# Patient Record
Sex: Male | Born: 1970
Health system: Southern US, Community
[De-identification: ages and names within clinical notes are randomized; demographics above are authoritative.]

---

## 1999-07-04 ENCOUNTER — Other Ambulatory Visit (HOSPITAL_COMMUNITY): Admission: RE | Admit: 1999-07-04 | Discharge: 1999-08-09 | Payer: Self-pay | Admitting: Psychiatry

## 2001-04-02 ENCOUNTER — Encounter: Payer: Self-pay | Admitting: Emergency Medicine

## 2001-04-02 ENCOUNTER — Emergency Department (HOSPITAL_COMMUNITY): Admission: EM | Admit: 2001-04-02 | Discharge: 2001-04-02 | Payer: Self-pay | Admitting: Emergency Medicine

## 2002-05-17 ENCOUNTER — Emergency Department (HOSPITAL_COMMUNITY): Admission: EM | Admit: 2002-05-17 | Discharge: 2002-05-17 | Payer: Self-pay | Admitting: Emergency Medicine

## 2003-04-11 ENCOUNTER — Encounter: Payer: Self-pay | Admitting: Family Medicine

## 2003-04-11 ENCOUNTER — Ambulatory Visit (HOSPITAL_COMMUNITY): Admission: RE | Admit: 2003-04-11 | Discharge: 2003-04-11 | Payer: Self-pay | Admitting: Family Medicine

## 2003-05-15 ENCOUNTER — Encounter: Payer: Self-pay | Admitting: Family Medicine

## 2003-05-15 ENCOUNTER — Ambulatory Visit (HOSPITAL_COMMUNITY): Admission: RE | Admit: 2003-05-15 | Discharge: 2003-05-15 | Payer: Self-pay | Admitting: Family Medicine

## 2005-04-08 ENCOUNTER — Emergency Department (HOSPITAL_COMMUNITY): Admission: EM | Admit: 2005-04-08 | Discharge: 2005-04-08 | Payer: Self-pay | Admitting: Emergency Medicine

## 2008-01-29 ENCOUNTER — Emergency Department (HOSPITAL_COMMUNITY): Admission: EM | Admit: 2008-01-29 | Discharge: 2008-01-29 | Payer: Self-pay | Admitting: Emergency Medicine

## 2011-07-08 LAB — RAPID URINE DRUG SCREEN, HOSP PERFORMED
Amphetamines: NOT DETECTED
Barbiturates: NOT DETECTED
Benzodiazepines: NOT DETECTED
Cocaine: NOT DETECTED
Opiates: NOT DETECTED
Tetrahydrocannabinol: NOT DETECTED

## 2011-07-08 LAB — URINALYSIS, ROUTINE W REFLEX MICROSCOPIC
Bilirubin Urine: NEGATIVE
Glucose, UA: NEGATIVE
Hgb urine dipstick: NEGATIVE
Ketones, ur: NEGATIVE
Nitrite: NEGATIVE
Protein, ur: NEGATIVE
Specific Gravity, Urine: 1.02
Urobilinogen, UA: 0.2
pH: 6.5

## 2011-07-08 LAB — POCT I-STAT, CHEM 8
BUN: 13
Calcium, Ion: 1.21
Chloride: 103
Creatinine, Ser: 1.1
Glucose, Bld: 93
HCT: 46
Hemoglobin: 15.6
Potassium: 3.7
Sodium: 142
TCO2: 30

## 2015-03-26 ENCOUNTER — Encounter (HOSPITAL_COMMUNITY): Payer: Self-pay | Admitting: Neurology

## 2015-03-26 ENCOUNTER — Emergency Department (HOSPITAL_COMMUNITY): Payer: Self-pay

## 2015-03-26 ENCOUNTER — Emergency Department (HOSPITAL_COMMUNITY)
Admission: EM | Admit: 2015-03-26 | Discharge: 2015-03-26 | Disposition: A | Discharge status: Home / Self Care | Payer: Self-pay | Attending: Emergency Medicine | Admitting: Emergency Medicine

## 2015-03-26 DIAGNOSIS — T07XXXA Unspecified multiple injuries, initial encounter: Secondary | ICD-10-CM

## 2015-03-26 DIAGNOSIS — S80212A Abrasion, left knee, initial encounter: Secondary | ICD-10-CM | POA: Insufficient documentation

## 2015-03-26 DIAGNOSIS — S3991XA Unspecified injury of abdomen, initial encounter: Secondary | ICD-10-CM | POA: Insufficient documentation

## 2015-03-26 DIAGNOSIS — Y998 Other external cause status: Secondary | ICD-10-CM | POA: Insufficient documentation

## 2015-03-26 DIAGNOSIS — Y92410 Unspecified street and highway as the place of occurrence of the external cause: Secondary | ICD-10-CM | POA: Insufficient documentation

## 2015-03-26 DIAGNOSIS — T148 Other injury of unspecified body region: Secondary | ICD-10-CM | POA: Insufficient documentation

## 2015-03-26 DIAGNOSIS — S80211A Abrasion, right knee, initial encounter: Secondary | ICD-10-CM | POA: Insufficient documentation

## 2015-03-26 DIAGNOSIS — Y9389 Activity, other specified: Secondary | ICD-10-CM | POA: Insufficient documentation

## 2015-03-26 LAB — COMPREHENSIVE METABOLIC PANEL
ALBUMIN: 4.7 g/dL (ref 3.5–5.0)
ALT: 17 U/L (ref 17–63)
ANION GAP: 18 — AB (ref 5–15)
AST: 30 U/L (ref 15–41)
Alkaline Phosphatase: 74 U/L (ref 38–126)
BUN: 18 mg/dL (ref 6–20)
CALCIUM: 10.1 mg/dL (ref 8.9–10.3)
CO2: 17 mmol/L — ABNORMAL LOW (ref 22–32)
CREATININE: 1.34 mg/dL — AB (ref 0.61–1.24)
Chloride: 102 mmol/L (ref 101–111)
GFR calc non Af Amer: >60 mL/min (ref >60)
GLUCOSE: 114 mg/dL — AB (ref 65–99)
Potassium: 3.6 mmol/L (ref 3.5–5.1)
Sodium: 137 mmol/L (ref 135–145)
TOTAL PROTEIN: 7.4 g/dL (ref 6.5–8.1)
Total Bilirubin: 0.7 mg/dL (ref 0.3–1.2)

## 2015-03-26 LAB — CBC
HEMATOCRIT: 45.3 % (ref 39.0–52.0)
Hemoglobin: 15.9 g/dL (ref 13.0–17.0)
MCH: 30.2 pg (ref 26.0–34.0)
MCHC: 35.1 g/dL (ref 30.0–36.0)
MCV: 86 fL (ref 78.0–100.0)
Platelets: 375 10*3/uL (ref 150–400)
RBC: 5.27 MIL/uL (ref 4.22–5.81)
RDW: 14 % (ref 11.5–15.5)
WBC: 13.2 10*3/uL — AB (ref 4.0–10.5)

## 2015-03-26 LAB — PROTIME-INR
INR: 1.21 (ref 0.00–1.49)
Prothrombin Time: 15.5 seconds — ABNORMAL HIGH (ref 11.6–15.2)

## 2015-03-26 LAB — ETHANOL: Alcohol, Ethyl (B): <5 mg/dL (ref <5)

## 2015-03-26 LAB — SAMPLE TO BLOOD BANK

## 2015-03-26 MED ORDER — SODIUM CHLORIDE 0.9 % IV BOLUS (SEPSIS)
1000.0000 mL | Freq: Once | INTRAVENOUS | Status: AC
  Administered 2015-03-26: 1000 mL via INTRAVENOUS

## 2015-03-26 MED ORDER — IOHEXOL 300 MG/ML  SOLN
100.0000 mL | Freq: Once | INTRAMUSCULAR | Status: AC | PRN
Start: 2015-03-26 — End: 2015-03-26
  Administered 2015-03-26: 100 mL via INTRAVENOUS

## 2015-03-26 MED ORDER — ONDANSETRON HCL 4 MG/2ML IJ SOLN
4.0000 mg | Freq: Once | INTRAMUSCULAR | Status: AC
  Administered 2015-03-26: 4 mg via INTRAVENOUS
  Filled 2015-03-26: qty 2

## 2015-03-26 NOTE — ED Notes (Signed)
Patient transported to CT 
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
## 2015-03-26 NOTE — ED Notes (Signed)
Pt is talking to RN, c/o pain all over mainly in legs.

## 2015-03-26 NOTE — Discharge Instructions (Signed)
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
## 2017-09-14 ENCOUNTER — Ambulatory Visit: Payer: Self-pay | Attending: Internal Medicine

## 2017-12-22 MED FILL — hydrOXYzine HCL 25 MG TABS: 25 | 30 days supply | Qty: 90 | Fill #0

## 2017-12-22 MED FILL — SERTRALINE HCL 100 MG TAB: 100 | 30 days supply | Qty: 30 | Fill #0

## 2017-12-22 MED FILL — DIVALPROEX SOD DR 500 MG TA: 500 | 30 days supply | Qty: 90 | Fill #0

## 2017-12-22 MED FILL — QUETIAPINE FUMARATE 200 MG: 200 | 30 days supply | Qty: 30 | Fill #0

## 2018-01-28 MED FILL — QUETIAPINE FUMARATE 200 MG: 200 | 30 days supply | Qty: 30 | Fill #1

## 2021-02-04 ENCOUNTER — Encounter (HOSPITAL_COMMUNITY): Payer: Self-pay | Admitting: Emergency Medicine

## 2021-02-04 ENCOUNTER — Emergency Department (HOSPITAL_COMMUNITY): Payer: Self-pay

## 2021-02-04 ENCOUNTER — Other Ambulatory Visit: Payer: Self-pay

## 2021-02-04 ENCOUNTER — Emergency Department (HOSPITAL_COMMUNITY)
Admission: EM | Admit: 2021-02-04 | Discharge: 2021-02-04 | Disposition: A | Discharge status: Home / Self Care | Payer: Self-pay | Attending: Emergency Medicine | Admitting: Emergency Medicine

## 2021-02-04 DIAGNOSIS — F17210 Nicotine dependence, cigarettes, uncomplicated: Secondary | ICD-10-CM | POA: Insufficient documentation

## 2021-02-04 DIAGNOSIS — S3992XA Unspecified injury of lower back, initial encounter: Secondary | ICD-10-CM | POA: Insufficient documentation

## 2021-02-04 DIAGNOSIS — W19XXXA Unspecified fall, initial encounter: Secondary | ICD-10-CM

## 2021-02-04 DIAGNOSIS — Y990 Civilian activity done for income or pay: Secondary | ICD-10-CM | POA: Insufficient documentation

## 2021-02-04 DIAGNOSIS — M546 Pain in thoracic spine: Secondary | ICD-10-CM | POA: Insufficient documentation

## 2021-02-04 DIAGNOSIS — W1789XA Other fall from one level to another, initial encounter: Secondary | ICD-10-CM | POA: Insufficient documentation

## 2021-02-04 DIAGNOSIS — W010XXA Fall on same level from slipping, tripping and stumbling without subsequent striking against object, initial encounter: Secondary | ICD-10-CM | POA: Insufficient documentation

## 2021-02-04 DIAGNOSIS — M533 Sacrococcygeal disorders, not elsewhere classified: Secondary | ICD-10-CM | POA: Insufficient documentation

## 2021-02-04 MED ORDER — METHOCARBAMOL 750 MG PO TABS: 750.0000 mg | ORAL_TABLET | Freq: Two times a day (BID) | ORAL | 0 refills | Status: DC | PRN

## 2021-02-04 MED ORDER — LIDOCAINE 5 % EX PTCH: 1.0000 | MEDICATED_PATCH | CUTANEOUS | 0 refills | Status: DC

## 2021-02-04 NOTE — ED Triage Notes (Signed)
Pt states he slipped getting off his truck at work this morning around 9am and fell approx 3-4 feet landing on lower back and butt area.

## 2021-02-04 NOTE — Discharge Instructions (Signed)

## 2021-02-04 NOTE — ED Provider Notes (Signed)
[facility] EMERGENCY DEPARTMENT Provider Note   CSN: [id number] Arrival date & time: 02/04/21  1142     History Chief Complaint  Patient presents with  . Fall    Zachary Hutchinson is a 50 y.o. male.  HPI      Zachary Hutchinson is a 50 y.o. male, presenting to the ED with back pain following a fall that occurred around 9 AM this morning.  Patient states he was at work, loading things into pickup truck, and fell out of the truck bed, landing with his back against the ground and on top of tree limbs that were on the ground.  He endorses soreness and tenderness to the lower back, midline buttocks, mid back, and left thoracic back over the ribs.  Denies head injury, numbness, weakness, shortness of breath, chest pain, abdominal pain, or any other complaints.   History reviewed. No pertinent past medical history.  There are no problems to display for this patient.   History reviewed. No pertinent surgical history.     History reviewed. No pertinent family history.  Social History   Tobacco Use  . Smoking status: Current Every Day Smoker    Packs/day: 0.50    Types: Cigarettes  . Smokeless tobacco: Never Used  Substance Use Topics  . Alcohol use: Not Currently  . Drug use: Yes    Types: Marijuana    Comment: occ.    Home Medications Prior to Admission medications   Medication Sig Start Date End Date Taking? Authorizing Provider  aspirin-acetaminophen-caffeine (EXCEDRIN MIGRAINE) [phone] MG tablet Take 2 tablets by mouth every 6 (six) hours as needed for headache.   Yes [provider]  lidocaine (LIDODERM) 5 % Place 1 patch onto the skin daily. Remove & Discard patch within 12 hours or as directed by MD 02/04/21  Yes Zygmunt Mcglinn C, PA-C  methocarbamol (ROBAXIN) 750 MG tablet Take 1 tablet (750 mg total) by mouth 2 (two) times daily as needed for muscle spasms (or muscle tightness). 02/04/21  Yes Jayland Null C, PA-C    Allergies    Patient has no known  allergies.  Review of Systems   Review of Systems  Constitutional: Negative for diaphoresis.  Respiratory: Negative for shortness of breath.   Cardiovascular: Negative for chest pain.  Gastrointestinal: Negative for abdominal pain, nausea and vomiting.  Musculoskeletal: Positive for back pain. Negative for neck pain.  Neurological: Negative for syncope, weakness and numbness.    Physical Exam Updated Vital Signs BP (!) 137/100   Pulse 91   Temp 97.9 F (36.6 C) (Oral)   Resp 14   Ht 6\' 1"  (1.854 m)   Wt 79.4 kg   SpO2 96%   BMI 23.09 kg/m   Physical Exam Vitals and nursing note reviewed.  Constitutional:      General: He is not in acute distress.    Appearance: He is well-developed. He is not diaphoretic.  HENT:     Head: Normocephalic and atraumatic.     Mouth/Throat:     Mouth: Mucous membranes are moist.     Pharynx: Oropharynx is clear.  Eyes:     Conjunctiva/sclera: Conjunctivae normal.  Cardiovascular:     Rate and Rhythm: Normal rate and regular rhythm.     Pulses: Normal pulses.          Radial pulses are 2+ on the right side and 2+ on the left side.       Posterior tibial pulses are 2+ on the right side  and 2+ on the left side.  Pulmonary:     Effort: Pulmonary effort is normal. No respiratory distress.     Breath sounds: Normal breath sounds.  Abdominal:     Palpations: Abdomen is soft.     Tenderness: There is no abdominal tenderness. There is no guarding.  Musculoskeletal:     Cervical back: Normal range of motion and neck supple. No tenderness.       Back:     Comments: No tenderness in the cervical spine or neck.  Full range of motion through the cervical spine without pain or noted difficulty.  Tenderness to the midline thoracic spine, left posterior thoracic ribs, midline lumbar spine, and midline sacrum without swelling, deformity, wounds, instability.  Skin:    General: Skin is warm and dry.     Coloration: Skin is not pale.  Neurological:      Mental Status: He is alert.     Comments: Sensation grossly intact to light touch in the lower extremities bilaterally. No saddle anesthesias. Strength 5/5 in the bilateral lower extremities. No noted gait deficit. Coordination intact.  Psychiatric:        Mood and Affect: Mood and affect normal.        Speech: Speech normal.        Behavior: Behavior normal.     ED Results / Procedures / Treatments   Labs (all labs ordered are listed, but only abnormal results are displayed) Labs Reviewed - No data to display  EKG None  Radiology DG Ribs Unilateral W/Chest Left  Result Date: 02/04/2021 CLINICAL DATA:  Fall from truck onto tree branches.  Left rib pain. EXAM: LEFT RIBS AND CHEST - 3+ VIEW COMPARISON:  None. FINDINGS: No fracture or other bone lesions are seen involving the ribs. There is no evidence of pneumothorax or pleural effusion. Both lungs are clear. Heart size and mediastinal contours are within normal limits. IMPRESSION: Negative radiographs of the chest and left ribs. Electronically Signed   By: Narda Rutherford M.D.   On: 02/04/2021 15:36   DG Thoracic Spine 2 View  Result Date: 02/04/2021 CLINICAL DATA:  Fall from truck onto tree branches. Pain. EXAM: THORACIC SPINE 2 VIEWS COMPARISON:  None. FINDINGS: The alignment is maintained. No evidence of acute fracture. Vertebral body heights are maintained. No significant disc space narrowing. Posterior elements appear intact. There is no paravertebral soft tissue abnormality. IMPRESSION: Negative radiographs of the thoracic spine. Electronically Signed   By: Narda Rutherford M.D.   On: 02/04/2021 15:35   DG Lumbar Spine Complete  Result Date: 02/04/2021 CLINICAL DATA:  Fall from truck onto tree branches. Pain. EXAM: LUMBAR SPINE - COMPLETE 4+ VIEW COMPARISON:  None. FINDINGS: There are 5 non-rib-bearing lumbar vertebra. The alignment is maintained. Vertebral body heights are normal. There is no listhesis. The posterior  elements are intact. No fracture. Disc space narrowing at L4-L5 and L5-S1. Mild L4-L5 and L5-S1 facet hypertrophy. Sacroiliac joints are symmetric and normal. IMPRESSION: 1. No fracture or subluxation of the lumbar spine. 2. Mild degenerative disc disease and facet arthropathy in the lower lumbar spine. Electronically Signed   By: Narda Rutherford M.D.   On: 02/04/2021 15:34   DG Sacrum/Coccyx  Result Date: 02/04/2021 CLINICAL DATA:  Fall from truck onto tree branches.  Pain. EXAM: SACRUM AND COCCYX - 2+ VIEW COMPARISON:  None. FINDINGS: Cortical margins of the sacrum and coccyx are intact. There is no evidence of fracture or other focal bone lesions. Sacroiliac joints are  congruent. No sacral ala disruption. IMPRESSION: Negative radiographs of the sacrum and coccyx. Electronically Signed   By: Narda Rutherford M.D.   On: 02/04/2021 15:32    Procedures Procedures   Medications Ordered in ED Medications - No data to display  ED Course  I have reviewed the triage vital signs and the nursing notes.  Pertinent labs & imaging results that were available during my care of the patient were reviewed by me and considered in my medical decision making (see chart for details).    MDM Rules/Calculators/A&P                          Patient presents for evaluation following a fall.  No focal neurologic deficits. I personally reviewed and interpreted the patient's imaging studies.  No acute abnormalities noted on these studies. The patient was given instructions for home care as well as return precautions. Patient voices understanding of these instructions, accepts the plan, and is comfortable with discharge.   Final Clinical Impression(s) / ED Diagnoses Final diagnoses:  Fall, initial encounter  Injury of back, initial encounter    Rx / DC Orders ED Discharge Orders         Ordered    methocarbamol (ROBAXIN) 750 MG tablet  2 times daily PRN        02/04/21 1548    lidocaine (LIDODERM) 5 %   Every 24 hours        02/04/21 1548           Anselm Pancoast, PA-C 02/04/21 1550    Long, Arlyss Repress, MD 02/05/21 [phone]

## 2021-02-04 NOTE — ED Notes (Signed)
Patient transported to X-ray 

## 2021-02-04 NOTE — ED Provider Notes (Signed)
Patient left without being seen following triage.   Anselm Pancoast, PA-C 02/04/21 1357    Maia Plan, MD 02/05/21 [phone]

## 2022-09-29 ENCOUNTER — Ambulatory Visit (INDEPENDENT_AMBULATORY_CARE_PROVIDER_SITE_OTHER): Payer: No Payment, Other | Admitting: Psychiatry

## 2022-09-29 ENCOUNTER — Other Ambulatory Visit: Payer: Self-pay

## 2022-09-29 ENCOUNTER — Encounter (HOSPITAL_COMMUNITY): Payer: Self-pay | Admitting: Psychiatry

## 2022-09-29 DIAGNOSIS — F1490 Cocaine use, unspecified, uncomplicated: Secondary | ICD-10-CM | POA: Diagnosis not present

## 2022-09-29 DIAGNOSIS — F1290 Cannabis use, unspecified, uncomplicated: Secondary | ICD-10-CM | POA: Diagnosis not present

## 2022-09-29 DIAGNOSIS — F4310 Post-traumatic stress disorder, unspecified: Secondary | ICD-10-CM

## 2022-09-29 DIAGNOSIS — F411 Generalized anxiety disorder: Secondary | ICD-10-CM | POA: Diagnosis not present

## 2022-09-29 DIAGNOSIS — F1994 Other psychoactive substance use, unspecified with psychoactive substance-induced mood disorder: Secondary | ICD-10-CM

## 2022-09-29 DIAGNOSIS — F17200 Nicotine dependence, unspecified, uncomplicated: Secondary | ICD-10-CM

## 2022-09-29 DIAGNOSIS — F17210 Nicotine dependence, cigarettes, uncomplicated: Secondary | ICD-10-CM

## 2022-09-29 MED ORDER — QUETIAPINE FUMARATE 100 MG PO TABS
100.0000 mg | ORAL_TABLET | Freq: Every day | ORAL | 3 refills | Status: AC
  Filled 2022-09-29: qty 30, 30d supply, fill #0

## 2022-09-29 MED ORDER — NICOTINE 14 MG/24HR TD PT24
14.0000 mg | MEDICATED_PATCH | Freq: Every day | TRANSDERMAL | 3 refills | Status: AC
  Filled 2022-09-29: qty 28, 28d supply, fill #0

## 2022-09-29 MED ORDER — PAROXETINE HCL 20 MG PO TABS
20.0000 mg | ORAL_TABLET | Freq: Every day | ORAL | 3 refills | Status: AC
  Filled 2022-09-29: qty 30, 30d supply, fill #0
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
## 2022-09-30 ENCOUNTER — Other Ambulatory Visit: Payer: Self-pay
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
## 2023-01-01 ENCOUNTER — Encounter (HOSPITAL_COMMUNITY): Payer: No Payment, Other | Admitting: Student

## 2023-01-15 ENCOUNTER — Encounter (HOSPITAL_COMMUNITY): Payer: No Payment, Other | Admitting: Student

## 2023-05-06 ENCOUNTER — Emergency Department (HOSPITAL_COMMUNITY)
Admission: EM | Admit: 2023-05-06 | Discharge: 2023-05-07 | Disposition: A | Discharge status: Home / Self Care | Payer: Self-pay | Attending: Emergency Medicine | Admitting: Emergency Medicine

## 2023-05-06 ENCOUNTER — Other Ambulatory Visit: Payer: Self-pay

## 2023-05-06 ENCOUNTER — Emergency Department (HOSPITAL_COMMUNITY): Payer: Self-pay

## 2023-05-06 ENCOUNTER — Encounter (HOSPITAL_COMMUNITY): Payer: Self-pay | Admitting: Emergency Medicine

## 2023-05-06 DIAGNOSIS — L02811 Cutaneous abscess of head [any part, except face]: Secondary | ICD-10-CM | POA: Insufficient documentation

## 2023-05-06 LAB — CBC WITH DIFFERENTIAL/PLATELET
Abs Immature Granulocytes: 0.06 10*3/uL (ref 0.00–0.07)
Basophils Absolute: 0.1 10*3/uL (ref 0.0–0.1)
Basophils Relative: 0 %
Eosinophils Absolute: 0.2 10*3/uL (ref 0.0–0.5)
Eosinophils Relative: 2 %
HCT: 44.9 % (ref 39.0–52.0)
Hemoglobin: 15 g/dL (ref 13.0–17.0)
Immature Granulocytes: 0 %
Lymphocytes Relative: 16 %
Lymphs Abs: 2.2 10*3/uL (ref 0.7–4.0)
MCH: 31.8 pg (ref 26.0–34.0)
MCHC: 33.4 g/dL (ref 30.0–36.0)
MCV: 95.3 fL (ref 80.0–100.0)
Monocytes Absolute: 1.3 10*3/uL — ABNORMAL HIGH (ref 0.1–1.0)
Monocytes Relative: 10 %
Neutro Abs: 9.8 10*3/uL — ABNORMAL HIGH (ref 1.7–7.7)
Neutrophils Relative %: 72 %
Platelets: 272 10*3/uL (ref 150–400)
RBC: 4.71 MIL/uL (ref 4.22–5.81)
RDW: 13.6 % (ref 11.5–15.5)
WBC: 13.6 10*3/uL — ABNORMAL HIGH (ref 4.0–10.5)
nRBC: 0 % (ref 0.0–0.2)

## 2023-05-06 LAB — BASIC METABOLIC PANEL
Anion gap: 10 (ref 5–15)
BUN: 16 mg/dL (ref 6–20)
CO2: 26 mmol/L (ref 22–32)
Calcium: 9 mg/dL (ref 8.9–10.3)
Chloride: 98 mmol/L (ref 98–111)
Creatinine, Ser: 0.71 mg/dL (ref 0.61–1.24)
GFR, Estimated: >60 mL/min (ref >60)
Glucose, Bld: 110 mg/dL — ABNORMAL HIGH (ref 70–99)
Potassium: 3.4 mmol/L — ABNORMAL LOW (ref 3.5–5.1)
Sodium: 134 mmol/L — ABNORMAL LOW (ref 135–145)

## 2023-05-06 MED ORDER — LIDOCAINE-EPINEPHRINE (PF) 2 %-1:200000 IJ SOLN
10.0000 mL | Freq: Once | INTRAMUSCULAR | Status: AC
  Administered 2023-05-06: 10 mL
  Filled 2023-05-06: qty 20

## 2023-05-06 MED ORDER — IOHEXOL 350 MG/ML SOLN
75.0000 mL | Freq: Once | INTRAVENOUS | Status: AC | PRN
  Administered 2023-05-06: 75 mL via INTRAVENOUS

## 2023-05-06 MED ORDER — DOXYCYCLINE HYCLATE 100 MG PO CAPS: 100.0000 mg | ORAL_CAPSULE | Freq: Two times a day (BID) | ORAL | 0 refills | Status: DC

## 2023-05-06 NOTE — ED Triage Notes (Signed)
Pt via POV c/o head injury after hitting the left side of his head on a tree limb 2 weeks ago. Pt has had "boils" on his scalp for a while, and he says that when he struck his head on the limb it hit one of the boils and now the entire area is mushy. Pain rated 10/10. Palpation shows that the swollen area on his scalp is firm to touch and feels warmer than the surrounding tissue with no significant redness noted. Pt denies tenderness to palpation.

## 2023-05-06 NOTE — ED Provider Notes (Signed)
[facility] AT [facility] Provider Note   CSN: [id number] Arrival date & time: 05/06/23  1447     History  Chief Complaint  Patient presents with   Head Injury    Nolton Denis is a 52 y.o. male.   Head Injury Patient presents with potential abscess and head injury.  States he did have some abscesses on his scalp.  Around 2 weeks ago he did have an abscess that he accidentally hit directly on a tree.  States now more swollen.  States headache.  Frontal headache that does go to the side of his head.  Some blurred vision.  May have some mild pain moving his eye.  No fevers.  Does have history of abscesses on his scalp.  No fevers     Home Medications Prior to Admission medications   Medication Sig Start Date End Date Taking? Authorizing Provider  doxycycline (VIBRAMYCIN) 100 MG capsule Take 1 capsule (100 mg total) by mouth 2 (two) times daily. 05/06/23  Yes Benjiman Core, MD  aspirin-acetaminophen-caffeine (EXCEDRIN MIGRAINE) [phone] MG tablet Take 2 tablets by mouth every 6 (six) hours as needed for headache.    [provider]  lidocaine (LIDODERM) 5 % Place 1 patch onto the skin daily. Remove & Discard patch within 12 hours or as directed by MD 02/04/21   Anselm Pancoast, PA-C  methocarbamol (ROBAXIN) 750 MG tablet Take 1 tablet (750 mg total) by mouth 2 (two) times daily as needed for muscle spasms (or muscle tightness). 02/04/21   Joy, Shawn C, PA-C  nicotine (NICODERM CQ) 14 mg/24hr patch Place 1 patch (14 mg total) onto the skin daily. 09/29/22   Shanna Cisco, NP  PARoxetine (PAXIL) 20 MG tablet Take 1 tablet (20 mg total) by mouth daily. 09/29/22   Shanna Cisco, NP  QUEtiapine (SEROQUEL) 100 MG tablet Take 1 tablet (100 mg total) by mouth at bedtime. 09/29/22   Shanna Cisco, NP      Allergies    Patient has no known allergies.    Review of Systems   Review of Systems  Physical Exam Updated Vital Signs BP  115/82 (BP Location: Right Arm)   Pulse 84   Temp 98 F (36.7 C)   Resp (!) 88   Ht 6\' 1"  (1.854 m)   Wt 72.6 kg   SpO2 100%   BMI 21.11 kg/m  Physical Exam Vitals and nursing note reviewed.  HENT:     Head:     Comments: Swollen area left frontal scalp.  Approximately 3 cm.  Fluctuant but no erythema. Eyes:     Extraocular Movements: Extraocular movements intact.     Pupils: Pupils are equal, round, and reactive to light.     Comments: No proptosis.  Cardiovascular:     Rate and Rhythm: Regular rhythm.  Musculoskeletal:        General: No tenderness.  Skin:    Capillary Refill: Capillary refill takes less than 2 seconds.  Neurological:     Mental Status: He is alert.     ED Results / Procedures / Treatments   Labs (all labs ordered are listed, but only abnormal results are displayed) Labs Reviewed  CBC WITH DIFFERENTIAL/PLATELET - Abnormal; Notable for the following components:      Result Value   WBC 13.6 (*)    Neutro Abs 9.8 (*)    Monocytes Absolute 1.3 (*)    All other components within normal limits  BASIC  METABOLIC PANEL - Abnormal; Notable for the following components:   Sodium 134 (*)    Potassium 3.4 (*)    Glucose, Bld 110 (*)    All other components within normal limits  AEROBIC CULTURE W GRAM STAIN (SUPERFICIAL SPECIMEN)    EKG None  Radiology CT ORBITS W WO CONTRAST  Result Date: 05/06/2023 CLINICAL DATA:  Initial evaluation for trauma, infection. EXAM: CT HEAD AND ORBITS WITHOUT AND WITH CONTRAST TECHNIQUE: Contiguous axial images were obtained from the base of the skull through the vertex with and without contrast. Multidetector CT imaging of the orbits was performed using the standard protocol with and without intravenous contrast. RADIATION DOSE REDUCTION: This exam was performed according to the departmental dose-optimization program which includes automated exposure control, adjustment of the mA and/or kV according to patient size and/or use  of iterative reconstruction technique. CONTRAST:  75mL OMNIPAQUE IOHEXOL 350 MG/ML SOLN COMPARISON:  Prior study from 03/26/2015. FINDINGS: CT HEAD FINDINGS Brain: Cerebral volume within normal limits. No acute intracranial hemorrhage. No acute large vessel territory infarct. No mass lesion, mass effect or midline shift. No hydrocephalus or extra-axial fluid collection. No abnormal enhancement. Vascular: No abnormal hyperdense vessel seen prior to contrast administration. Normal intravascular enhancement seen following contrast administration. Skull: Heterogeneous rim enhancing collection at the left frontal scalp measures 1.4 x 4.2 x 3.7 cm, indeterminate, but suspicious for possible infection with abscess. Underlying calvarium intact. No fracture. No visible evidence for intracranial spread of infection. Other: Mastoid air cells and middle ear cavities are clear. CT ORBITS FINDINGS Orbits: Globes are symmetric in size with normal appearance and morphology. Optic nerves symmetric and within normal limits. Extra-ocular muscles symmetric and normal. Intraconal and extraconal fat maintained. Lacrimal glands normal. No abnormal enhancement. No evidence for periorbital or intraorbital cellulitis. No collections. Visualized sinuses: Scattered mucoperiosteal thickening present about the ethmoidal air cells. Visualized paranasal sinuses are otherwise clear. Soft tissues: Unremarkable. IMPRESSION: 1. 1.4 x 4.2 x 3.7 cm rim enhancing collection at the left frontal scalp, indeterminate, but suspicious for possible infection with abscess. No visible evidence for intracranial spread of infection. 2. No other acute intracranial abnormality. No CT evidence for trauma. 3. Normal CT of the orbits. No evidence for periorbital or intraorbital cellulitis. Electronically Signed   By: Rise Mu M.D.   On: 05/06/2023 22:37   CT HEAD W & WO CONTRAST ( )  Result Date: 05/06/2023 CLINICAL DATA:  Initial evaluation for  trauma, infection. EXAM: CT HEAD AND ORBITS WITHOUT AND WITH CONTRAST TECHNIQUE: Contiguous axial images were obtained from the base of the skull through the vertex with and without contrast. Multidetector CT imaging of the orbits was performed using the standard protocol with and without intravenous contrast. RADIATION DOSE REDUCTION: This exam was performed according to the departmental dose-optimization program which includes automated exposure control, adjustment of the mA and/or kV according to patient size and/or use of iterative reconstruction technique. CONTRAST:  75mL OMNIPAQUE IOHEXOL 350 MG/ML SOLN COMPARISON:  Prior study from 03/26/2015. FINDINGS: CT HEAD FINDINGS Brain: Cerebral volume within normal limits. No acute intracranial hemorrhage. No acute large vessel territory infarct. No mass lesion, mass effect or midline shift. No hydrocephalus or extra-axial fluid collection. No abnormal enhancement. Vascular: No abnormal hyperdense vessel seen prior to contrast administration. Normal intravascular enhancement seen following contrast administration. Skull: Heterogeneous rim enhancing collection at the left frontal scalp measures 1.4 x 4.2 x 3.7 cm, indeterminate, but suspicious for possible infection with abscess. Underlying calvarium intact. No fracture. No  visible evidence for intracranial spread of infection. Other: Mastoid air cells and middle ear cavities are clear. CT ORBITS FINDINGS Orbits: Globes are symmetric in size with normal appearance and morphology. Optic nerves symmetric and within normal limits. Extra-ocular muscles symmetric and normal. Intraconal and extraconal fat maintained. Lacrimal glands normal. No abnormal enhancement. No evidence for periorbital or intraorbital cellulitis. No collections. Visualized sinuses: Scattered mucoperiosteal thickening present about the ethmoidal air cells. Visualized paranasal sinuses are otherwise clear. Soft tissues: Unremarkable. IMPRESSION: 1. 1.4  x 4.2 x 3.7 cm rim enhancing collection at the left frontal scalp, indeterminate, but suspicious for possible infection with abscess. No visible evidence for intracranial spread of infection. 2. No other acute intracranial abnormality. No CT evidence for trauma. 3. Normal CT of the orbits. No evidence for periorbital or intraorbital cellulitis. Electronically Signed   By: Rise Mu M.D.   On: 05/06/2023 22:37    Procedures Procedures    Medications Ordered in ED Medications  lidocaine-EPINEPHrine (XYLOCAINE W/EPI) 2 %-1:200000 (PF) injection 10 mL (has no administration in time range)  iohexol (OMNIPAQUE) 350 MG/ML injection 75 mL (75 mLs Intravenous Contrast Given 05/06/23 2153)    ED Course/ Medical Decision Making/ A&P                             Medical Decision Making Amount and/or Complexity of Data Reviewed Labs: ordered. Radiology: ordered.  Risk Prescription drug management.   Patient with swelling on scalp.  Did have abscess at the site.  Now swelling.  Differential does include abscess.  Potentially with deeper extension.  Discussed with radiologist Dr. Celine Mans.  Recommended head and orbit CT with and without contrast.  These were done and showed likely abscess with no deeper extension.  Will drain and send culture.  Likely require antibiotics.  Does not appear septic at this time.  White count is mildly elevated.        Final Clinical Impression(s) / ED Diagnoses Final diagnoses:  Scalp abscess    Rx / DC Orders ED Discharge Orders          Ordered    doxycycline (VIBRAMYCIN) 100 MG capsule  2 times daily        05/06/23 2328              Benjiman Core, MD 05/06/23 [phone]

## 2023-05-06 NOTE — Discharge Instructions (Addendum)
You were seen in the ER today for your abscess. This was drained in the ER. You have been prescribed an antibiotic to take. Please complete the entire course. Follow up with your primary care doctor and return to the ER with any recurrent increased swelling, fevers, chills, or any new severe symptoms.

## 2023-05-06 NOTE — ED Notes (Signed)
Lab technician advised RN to recollect BMET blood specimen .

## 2023-05-07 LAB — AEROBIC CULTURE W GRAM STAIN (SUPERFICIAL SPECIMEN)

## 2023-05-07 NOTE — ED Provider Notes (Signed)
  Physical Exam  BP 106/75 (BP Location: Right Arm)   Pulse 92   Temp 100 F (37.8 C) (Oral)   Resp 20   Ht 6\' 1"  (1.854 m)   Wt 72.6 kg   SpO2 100%   BMI 21.11 kg/m   Physical Exam  Procedures  .Marland KitchenIncision and Drainage  Date/Time: 05/07/2023 1:36 AM  Performed by: Paris Lore, PA-C Authorized by: Paris Lore, PA-C   Consent:    Consent obtained:  Verbal   Consent given by:  Patient   Risks discussed:  Bleeding, incomplete drainage, pain and damage to other organs   Alternatives discussed:  No treatment Universal protocol:    Procedure explained and questions answered to patient or proxy's satisfaction: yes     Relevant documents present and verified: yes     Test results available : yes     Imaging studies available: yes     Required blood products, implants, devices, and special equipment available: yes     Site/side marked: yes     Immediately prior to procedure, a time out was called: yes     Patient identity confirmed:  Verbally with patient Location:    Type:  Abscess   Size:  3.5 cm   Location:  Head   Head location:  Scalp Pre-procedure details:    Skin preparation:  Betadine Sedation:    Sedation type:  None Anesthesia:    Anesthesia method:  Local infiltration   Local anesthetic:  Lidocaine 2% WITH epi Procedure type:    Complexity:  Complex Procedure details:    Incision types:  Single straight   Incision depth:  Subcutaneous   Wound management:  Probed and deloculated, irrigated with saline and extensive cleaning   Drainage:  Purulent and serosanguinous   Drainage amount:  Copious   Wound treatment:  Wound left open   Packing materials:  None Post-procedure details:    Procedure completion:  Tolerated well, no immediate complications   ED Course / MDM    Medical Decision Making Amount and/or Complexity of Data Reviewed Labs: ordered. Radiology: ordered.  Risk Prescription drug management.   Participate in the care  of this patient only for incision and drainage of left frontal abscess.  Please see the associated note of Dr. Rubin Payor, primary EDP for this patient for further insight and the patient's ED course.  This chart was dictated using voice recognition software, Dragon. Despite the best efforts of this provider to proofread and correct errors, errors may still occur which can change documentation meaning.    Paris Lore, PA-C 05/07/23 0145    Glendora Score, MD 05/07/23 0400

## 2023-05-10 ENCOUNTER — Telehealth (HOSPITAL_BASED_OUTPATIENT_CLINIC_OR_DEPARTMENT_OTHER): Payer: Self-pay | Admitting: *Deleted

## 2023-05-10 NOTE — Telephone Encounter (Signed)
Post ED Visit - Positive Culture Follow-up  Culture report reviewed by antimicrobial stewardship pharmacist: [facility] []  Enzo Bi, Pharm.D. []  Celedonio Miyamoto, Pharm.D., BCPS AQ-ID []  Garvin Fila, Pharm.D., BCPS []  Georgina Pillion, Pharm.D., BCPS []  [place], [place].D., BCPS, AAHIVP []  Estella Husk, Pharm.D., BCPS, AAHIVP []  Lysle Pearl, PharmD, BCPS []  Phillips Climes, PharmD, BCPS []  Agapito Games, PharmD, BCPS []  Verlan Friends, PharmD []  Mervyn Gay, PharmD, BCPS [x]  Eldridge Scot,  PharmD  [facility] []  Len Childs, PharmD []  Greer Pickerel, PharmD []  Adalberto Cole, PharmD []  Perlie Gold, Rph []  Lonell Face) Jean Rosenthal, PharmD []  Earl Many, PharmD []  Junita Push, PharmD []  Dorna Leitz, PharmD []  Terrilee Files, PharmD []  Lynann Beaver, PharmD []  Keturah Barre, PharmD []  Loralee Pacas, PharmD []  Bernadene Person, PharmD   Positive aerobic culture Treated with Doxycycline, organism sensitive to the same and no further patient follow-up is required at this time.  Patsey Berthold 05/10/2023, 11:22 AM

## 2023-05-24 ENCOUNTER — Emergency Department (HOSPITAL_COMMUNITY): Payer: Self-pay

## 2023-05-24 ENCOUNTER — Inpatient Hospital Stay (HOSPITAL_COMMUNITY)
Admission: EM | Admit: 2023-05-24 | Discharge: 2023-05-29 | DRG: 645 | Disposition: A | Discharge status: Home / Self Care | Payer: Self-pay | Attending: Internal Medicine | Admitting: Internal Medicine

## 2023-05-24 ENCOUNTER — Other Ambulatory Visit: Payer: Self-pay

## 2023-05-24 ENCOUNTER — Encounter (HOSPITAL_COMMUNITY): Payer: Self-pay

## 2023-05-24 DIAGNOSIS — F419 Anxiety disorder, unspecified: Secondary | ICD-10-CM | POA: Diagnosis present

## 2023-05-24 DIAGNOSIS — F17210 Nicotine dependence, cigarettes, uncomplicated: Secondary | ICD-10-CM | POA: Diagnosis present

## 2023-05-24 DIAGNOSIS — E861 Hypovolemia: Secondary | ICD-10-CM | POA: Diagnosis present

## 2023-05-24 DIAGNOSIS — Z79899 Other long term (current) drug therapy: Secondary | ICD-10-CM

## 2023-05-24 DIAGNOSIS — A084 Viral intestinal infection, unspecified: Secondary | ICD-10-CM | POA: Diagnosis present

## 2023-05-24 DIAGNOSIS — E876 Hypokalemia: Secondary | ICD-10-CM | POA: Diagnosis present

## 2023-05-24 DIAGNOSIS — R197 Diarrhea, unspecified: Secondary | ICD-10-CM

## 2023-05-24 DIAGNOSIS — F1910 Other psychoactive substance abuse, uncomplicated: Secondary | ICD-10-CM | POA: Diagnosis present

## 2023-05-24 DIAGNOSIS — R7401 Elevation of levels of liver transaminase levels: Secondary | ICD-10-CM | POA: Diagnosis present

## 2023-05-24 DIAGNOSIS — E222 Syndrome of inappropriate secretion of antidiuretic hormone: Principal | ICD-10-CM | POA: Diagnosis present

## 2023-05-24 DIAGNOSIS — R112 Nausea with vomiting, unspecified: Secondary | ICD-10-CM | POA: Diagnosis present

## 2023-05-24 DIAGNOSIS — E871 Hypo-osmolality and hyponatremia: Principal | ICD-10-CM | POA: Diagnosis present

## 2023-05-24 LAB — COMPREHENSIVE METABOLIC PANEL
ALT: 71 U/L — ABNORMAL HIGH (ref 0–44)
AST: 60 U/L — ABNORMAL HIGH (ref 15–41)
Albumin: 2.5 g/dL — ABNORMAL LOW (ref 3.5–5.0)
Alkaline Phosphatase: 82 U/L (ref 38–126)
Anion gap: 11 (ref 5–15)
BUN: 17 mg/dL (ref 6–20)
CO2: 24 mmol/L (ref 22–32)
Calcium: 7.9 mg/dL — ABNORMAL LOW (ref 8.9–10.3)
Chloride: 85 mmol/L — ABNORMAL LOW (ref 98–111)
Creatinine, Ser: 0.7 mg/dL (ref 0.61–1.24)
GFR, Estimated: >60 mL/min (ref >60)
Glucose, Bld: 93 mg/dL (ref 70–99)
Potassium: 3.4 mmol/L — ABNORMAL LOW (ref 3.5–5.1)
Sodium: 120 mmol/L — ABNORMAL LOW (ref 135–145)
Total Bilirubin: 1 mg/dL (ref 0.3–1.2)
Total Protein: 5.7 g/dL — ABNORMAL LOW (ref 6.5–8.1)

## 2023-05-24 LAB — URINALYSIS, ROUTINE W REFLEX MICROSCOPIC
Bilirubin Urine: NEGATIVE
Glucose, UA: NEGATIVE mg/dL
Hgb urine dipstick: NEGATIVE
Ketones, ur: 20 mg/dL — AB
Leukocytes,Ua: NEGATIVE
Nitrite: NEGATIVE
Protein, ur: NEGATIVE mg/dL
Specific Gravity, Urine: 1.015 (ref 1.005–1.030)
pH: 5 (ref 5.0–8.0)

## 2023-05-24 LAB — CBC WITH DIFFERENTIAL/PLATELET
Abs Immature Granulocytes: 0.06 10*3/uL (ref 0.00–0.07)
Basophils Absolute: 0 10*3/uL (ref 0.0–0.1)
Basophils Relative: 0 %
Eosinophils Absolute: 0.2 10*3/uL (ref 0.0–0.5)
Eosinophils Relative: 2 %
HCT: 34.4 % — ABNORMAL LOW (ref 39.0–52.0)
Hemoglobin: 12.6 g/dL — ABNORMAL LOW (ref 13.0–17.0)
Immature Granulocytes: 1 %
Lymphocytes Relative: 30 %
Lymphs Abs: 2.9 10*3/uL (ref 0.7–4.0)
MCH: 30.7 pg (ref 26.0–34.0)
MCHC: 36.6 g/dL — ABNORMAL HIGH (ref 30.0–36.0)
MCV: 83.7 fL (ref 80.0–100.0)
Monocytes Absolute: 0.5 10*3/uL (ref 0.1–1.0)
Monocytes Relative: 5 %
Neutro Abs: 5.9 10*3/uL (ref 1.7–7.7)
Neutrophils Relative %: 62 %
Platelets: 218 10*3/uL (ref 150–400)
RBC: 4.11 MIL/uL — ABNORMAL LOW (ref 4.22–5.81)
RDW: 13.3 % (ref 11.5–15.5)
WBC Morphology: ABNORMAL
WBC: 9.8 10*3/uL (ref 4.0–10.5)
nRBC: 0 % (ref 0.0–0.2)

## 2023-05-24 LAB — SODIUM: Sodium: 122 mmol/L — ABNORMAL LOW (ref 135–145)

## 2023-05-24 LAB — LIPASE, BLOOD: Lipase: 86 U/L — ABNORMAL HIGH (ref 11–51)

## 2023-05-24 MED ORDER — POTASSIUM CHLORIDE 10 MEQ/100ML IV SOLN
10.0000 meq | INTRAVENOUS | Status: AC
  Administered 2023-05-24 (×2): 10 meq via INTRAVENOUS
  Filled 2023-05-24 (×2): qty 100

## 2023-05-24 MED ORDER — SODIUM CHLORIDE 0.9 % IV SOLN: INTRAVENOUS | Status: AC

## 2023-05-24 MED ORDER — ONDANSETRON HCL 4 MG PO TABS
4.0000 mg | ORAL_TABLET | Freq: Four times a day (QID) | ORAL | Status: DC | PRN
  Administered 2023-05-28 (×3): 4 mg via ORAL
  Filled 2023-05-24 (×3): qty 1

## 2023-05-24 MED ORDER — ONDANSETRON HCL 4 MG/2ML IJ SOLN
4.0000 mg | Freq: Once | INTRAMUSCULAR | Status: AC
  Administered 2023-05-24: 4 mg via INTRAVENOUS
  Filled 2023-05-24: qty 2

## 2023-05-24 MED ORDER — ACETAMINOPHEN 325 MG PO TABS
650.0000 mg | ORAL_TABLET | Freq: Once | ORAL | Status: AC
  Administered 2023-05-24: 650 mg via ORAL
  Filled 2023-05-24: qty 2

## 2023-05-24 MED ORDER — ENOXAPARIN SODIUM 40 MG/0.4ML IJ SOSY
40.0000 mg | PREFILLED_SYRINGE | INTRAMUSCULAR | Status: DC
  Administered 2023-05-24 – 2023-05-28 (×5): 40 mg via SUBCUTANEOUS
  Filled 2023-05-24 (×5): qty 0.4

## 2023-05-24 MED ORDER — SODIUM CHLORIDE 0.9 % IV SOLN: INTRAVENOUS | Status: DC

## 2023-05-24 MED ORDER — ACETAMINOPHEN 325 MG PO TABS: 650.0000 mg | ORAL_TABLET | Freq: Four times a day (QID) | ORAL | Status: DC | PRN

## 2023-05-24 MED ORDER — IOHEXOL 300 MG/ML  SOLN
100.0000 mL | Freq: Once | INTRAMUSCULAR | Status: AC | PRN
  Administered 2023-05-24: 100 mL via INTRAVENOUS

## 2023-05-24 MED ORDER — ONDANSETRON HCL 4 MG/2ML IJ SOLN: 4.0000 mg | Freq: Four times a day (QID) | INTRAMUSCULAR | Status: DC | PRN

## 2023-05-24 MED ORDER — MORPHINE SULFATE (PF) 4 MG/ML IV SOLN
4.0000 mg | Freq: Once | INTRAVENOUS | Status: AC
  Administered 2023-05-24: 4 mg via INTRAVENOUS
  Filled 2023-05-24: qty 1

## 2023-05-24 MED ORDER — ACETAMINOPHEN 650 MG RE SUPP: 650.0000 mg | Freq: Four times a day (QID) | RECTAL | Status: DC | PRN

## 2023-05-24 MED ORDER — OXYCODONE HCL 5 MG PO TABS
5.0000 mg | ORAL_TABLET | ORAL | Status: DC | PRN
  Administered 2023-05-25 – 2023-05-28 (×8): 5 mg via ORAL
  Filled 2023-05-24 (×9): qty 1

## 2023-05-24 MED ORDER — SODIUM CHLORIDE 0.9 % IV BOLUS
1000.0000 mL | Freq: Once | INTRAVENOUS | Status: AC
  Administered 2023-05-24: 1000 mL via INTRAVENOUS

## 2023-05-24 MED ORDER — FENTANYL CITRATE PF 50 MCG/ML IJ SOSY: 12.5000 ug | PREFILLED_SYRINGE | INTRAMUSCULAR | Status: DC | PRN

## 2023-05-24 NOTE — ED Provider Notes (Signed)
EMERGENCY DEPARTMENT AT [facility] Provider Note   CSN: [id number] Arrival date & time: 05/24/23  1706     History  Chief Complaint  Patient presents with   Headache    Zachary Hutchinson is a 52 y.o. male presenting for evaluation of a 4-day history of nausea vomiting and diarrhea, although he states his last episode of diarrhea was yesterday, he has also had no p.o. intake in at least the past 24 hours.  He describes significant generalized weakness along with subjective fever. He also reports intermittent upper abdominal pain, sometimes sharp, sometimes just mild achy pain.  He has had recent antibiotic exposure, he was seen early July for a abscess of his scalp after scraping his head on a tree limb causing infection and abscess.  He had this area I indeed after which he was placed on doxycycline.  He denies hematemesis and bloody stools.  Has had no medications nor is he found any alleviators for symptoms prior to arrival.  He does have complaints of a small soft spot which is tender at his scalp and is concerned about possible return of infection as well.  The history is provided by the patient and a parent.       Home Medications Prior to Admission medications   Medication Sig Start Date End Date Taking? Authorizing Provider  aspirin-acetaminophen-caffeine (EXCEDRIN MIGRAINE) [phone] MG tablet Take 2 tablets by mouth every 6 (six) hours as needed for headache.    [provider]  doxycycline (VIBRAMYCIN) 100 MG capsule Take 1 capsule (100 mg total) by mouth 2 (two) times daily. 05/06/23   Benjiman Core, MD  lidocaine (LIDODERM) 5 % Place 1 patch onto the skin daily. Remove & Discard patch within 12 hours or as directed by MD 02/04/21   Anselm Pancoast, PA-C  methocarbamol (ROBAXIN) 750 MG tablet Take 1 tablet (750 mg total) by mouth 2 (two) times daily as needed for muscle spasms (or muscle tightness). 02/04/21   Joy, Shawn C, PA-C  nicotine (NICODERM  CQ) 14 mg/24hr patch Place 1 patch (14 mg total) onto the skin daily. 09/29/22   Shanna Cisco, NP  PARoxetine (PAXIL) 20 MG tablet Take 1 tablet (20 mg total) by mouth daily. 09/29/22   Shanna Cisco, NP  QUEtiapine (SEROQUEL) 100 MG tablet Take 1 tablet (100 mg total) by mouth at bedtime. 09/29/22   Shanna Cisco, NP      Allergies    Patient has no known allergies.    Review of Systems   Review of Systems  Constitutional:  Positive for fatigue. Negative for fever.  HENT:  Negative for congestion and sore throat.   Eyes: Negative.   Respiratory:  Negative for chest tightness and shortness of breath.   Cardiovascular:  Negative for chest pain.  Gastrointestinal:  Positive for diarrhea, nausea and vomiting. Negative for abdominal pain.  Genitourinary:  Positive for decreased urine volume.  Musculoskeletal:  Negative for arthralgias, joint swelling and neck pain.  Skin: Negative.  Negative for rash and wound.  Neurological:  Positive for weakness and headaches. Negative for dizziness, light-headedness and numbness.  Psychiatric/Behavioral: Negative.    All other systems reviewed and are negative.   Physical Exam Updated Vital Signs BP 102/73   Pulse 74   Temp 98 F (36.7 C) (Oral)   Resp 18   Ht 6\' 1"  (1.854 m)   Wt 72.6 kg   SpO2 97%   BMI 21.11 kg/m  Physical  Exam Vitals and nursing note reviewed.  Constitutional:      Appearance: He is well-developed.  HENT:     Head: Atraumatic.     Comments: Several healed abrasions with hypopigmentation.  Small tender scalp left frontal scalp with no erythema, induration or suggestion of returning abscess.    Mouth/Throat:     Mouth: Mucous membranes are dry.  Eyes:     Conjunctiva/sclera: Conjunctivae normal.  Cardiovascular:     Rate and Rhythm: Normal rate and regular rhythm.     Heart sounds: Normal heart sounds.  Pulmonary:     Effort: Pulmonary effort is normal.     Breath sounds: Normal breath sounds.  No wheezing.  Abdominal:     General: Bowel sounds are normal.     Palpations: Abdomen is soft.     Tenderness: There is abdominal tenderness.  Musculoskeletal:        General: Normal range of motion.     Cervical back: Normal range of motion.  Skin:    General: Skin is warm and dry.  Neurological:     Mental Status: He is alert.     ED Results / Procedures / Treatments   Labs (all labs ordered are listed, but only abnormal results are displayed) Labs Reviewed  CBC WITH DIFFERENTIAL/PLATELET - Abnormal; Notable for the following components:      Result Value   RBC 4.11 (*)    Hemoglobin 12.6 (*)    HCT 34.4 (*)    MCHC 36.6 (*)    All other components within normal limits  COMPREHENSIVE METABOLIC PANEL - Abnormal; Notable for the following components:   Sodium 120 (*)    Potassium 3.4 (*)    Chloride 85 (*)    Calcium 7.9 (*)    Total Protein 5.7 (*)    Albumin 2.5 (*)    AST 60 (*)    ALT 71 (*)    All other components within normal limits  LIPASE, BLOOD - Abnormal; Notable for the following components:   Lipase 86 (*)    All other components within normal limits  URINALYSIS, ROUTINE W REFLEX MICROSCOPIC - Abnormal; Notable for the following components:   Ketones, ur 20 (*)    All other components within normal limits  C DIFFICILE QUICK SCREEN W PCR REFLEX      EKG None  Radiology CT ABDOMEN PELVIS W CONTRAST  Result Date: 05/24/2023 CLINICAL DATA:  Abdominal pain, headache, weakness EXAM: CT ABDOMEN AND PELVIS WITH CONTRAST TECHNIQUE: Multidetector CT imaging of the abdomen and pelvis was performed using the standard protocol following bolus administration of intravenous contrast. RADIATION DOSE REDUCTION: This exam was performed according to the departmental dose-optimization program which includes automated exposure control, adjustment of the mA and/or kV according to patient size and/or use of iterative reconstruction technique. CONTRAST:  OMNIPAQUE  IOHEXOL 300 MG/ML  SOLN COMPARISON:  03/26/2015 FINDINGS: Lower chest: Mild dependent atelectasis in the bilateral lower lobes. Hepatobiliary: Liver is within normal limits. Gallbladder is unremarkable. No intrahepatic or extrahepatic duct dilatation. Pancreas: Within normal limits. Spleen: Within normal limits. Adrenals/Urinary Tract: Adrenal glands are within normal limits. Simple bilateral renal cysts, measuring up to 17 mm in the medial right upper kidney (series 7/image 19), benign (Bosniak I). No follow-up is recommended. Punctate nonobstructing left lower pole renal calculus (series 2/image 35). No hydronephrosis. Bladder is within normal limits. Stomach/Bowel: Stomach is within normal limits. No evidence of bowel obstruction. Appendix is not discretely visualized. No colonic wall  thickening or inflammatory changes. Vascular/Lymphatic: No evidence of abdominal aortic aneurysm. Atherosclerotic calcifications of the abdominal aorta and branch vessels, although vessels remain patent. No suspicious abdominopelvic lymphadenopathy. Reproductive: Prostate is unremarkable. Other: No abdominopelvic ascites. Musculoskeletal: Mild degenerative changes at L4-5. IMPRESSION: No CT findings to account for the patient's abdominal pain. Punctate nonobstructing left lower pole renal calculus. No hydronephrosis. Electronically Signed   By: Charline Bills M.D.   On: 05/24/2023 21:25    Procedures Procedures    Medications Ordered in ED Medications  0.9 %  sodium chloride infusion ( Intravenous New Bag/Given 05/24/23 2021)  morphine (PF) 4 MG/ML injection 4 mg (has no administration in time range)  sodium chloride 0.9 % bolus 1,000 mL (0 mLs Intravenous Stopped 05/24/23 2021)  ondansetron (ZOFRAN) injection 4 mg (4 mg Intravenous Given 05/24/23 1912)  acetaminophen (TYLENOL) tablet 650 mg (650 mg Oral Given 05/24/23 1911)  iohexol (OMNIPAQUE) 300 MG/ML solution 100 mL (100 mLs Intravenous Contrast Given 05/24/23 2113)     ED Course/ Medical Decision Making/ A&P                                 Medical Decision Making Patient presenting with generalized weakness along with a 4-day history of nausea vomiting and diarrhea although his diarrhea resolved yesterday without any intervention.  He has been unable to keep any p.o. down however secondary to persistent nausea and vomiting.  Increasing weakness, subjective fevers at home.  He was placed on doxycycline on July 25 secondary to an infection/abscess of his scalp which she has completed, but has complaints of a continued sore spot on his scalp and is concerned about returning infection.  Given his antibiotic exposure it is possible he has developed C. difficile, however he has not had diarrhea since yesterday so this is probably less likely, more likely that he has a viral gastroenteritis.  I see no evidence of returning scalp infection or abscess on today's exam all he is point tender with a trace of fluctuance without erythema, measuring about 1 cm diameter on his left frontal scalp.  Amount and/or Complexity of Data Reviewed Labs: ordered.    Details: Labs are significant for normal WBC count at 9.8, he has a lipase of 86, his sodium is significantly reduced at 120, potassium of 3.4, AST 60, ALT 71.  These labs were discussed with patient, we confirmed he does not drink alcohol, he has not had excessive Tylenol use, he states he took 2 tablets today and 2 tablets 2 days ago.  CT abd completed to rule out hepatobiliary or pancreatic source of abdominal symptoms.   Radiology: ordered.    Details: CT abdomen and pelvis negative for acute findings. Discussion of management or test interpretation with external provider(s): Patient discussed with Dr. Antionette Char who accepts patient for admission  Risk Decision regarding hospitalization.           Final Clinical Impression(s) / ED Diagnoses Final diagnoses:  Hyponatremia  Nausea and vomiting, unspecified  vomiting type  Diarrhea, unspecified type    Rx / DC Orders ED Discharge Orders     None         Victoriano Lain 05/24/23 2219    Eber Hong, MD 05/25/23 [phone]

## 2023-05-24 NOTE — ED Triage Notes (Signed)
"  Patient states he hit his head on a tree limb the first of July. Was seen here July 24th for boils on his scalp because the area was mushy and had to have it drained. Now I don't feel good at all again, just like I did when it needed to be drained last time. I have head pain, feel weak and have no appetite. Patient points to top of head stating "it feels mushy". No sores or broken skin noted on the area patient is pointing to.

## 2023-05-24 NOTE — H&P (Signed)
History and Physical    Zachary Hutchinson RUE:[id number] DOB: 20-May-1971 DOA: 05/24/2023  PCP: Pcp, No   Patient coming from: Home   Chief Complaint: N/V/D, abdominal pain   HPI: Zachary Hutchinson is a 52 year old male with medical history significant for substance abuse, anxiety, and scalp abscess that was drained on 05/06/2023, now presenting for evaluation of intermittent abdominal pain, nausea, vomiting, and diarrhea.  Patient reports he developed pain across his lower abdomen 4 days ago.  This has been associated with nausea, vomiting, and diarrhea.  He has not been able to keep any food or drink down during this illness.  He has not had any diarrhea today but continues to have nausea and inability to tolerate any oral intake.  He denies hematemesis, melena, hematochezia, chest pain, or shortness of breath.  ED Course: Upon arrival to the ED, patient is found to be afebrile and saturating well on room air with normal heart rate and stable blood pressure.  Labs are most notable for sodium 120, potassium 3.4, albumin 2.5, AST 60, ALT 71, normal bilirubin, normal WBC, and normal renal function.  He was treated with acetaminophen, Zofran, morphine, and 1 L of normal saline in the ED.  Review of Systems:  All other systems reviewed and apart from HPI, are negative.  History reviewed. No pertinent past medical history.  History reviewed. No pertinent surgical history.  Social History:   reports that he has been smoking cigarettes. He has never used smokeless tobacco. He reports that he does not currently use alcohol. He reports current drug use. Drug: Marijuana.  No Known Allergies  History reviewed. No pertinent family history.   Prior to Admission medications   Medication Sig Start Date End Date Taking? Authorizing Provider  aspirin-acetaminophen-caffeine (EXCEDRIN MIGRAINE) [phone] MG tablet Take 2 tablets by mouth every 6 (six) hours as needed for headache.    [provider]  doxycycline (VIBRAMYCIN) 100 MG capsule Take 1 capsule (100 mg total) by mouth 2 (two) times daily. 05/06/23   Benjiman Core, MD  lidocaine (LIDODERM) 5 % Place 1 patch onto the skin daily. Remove & Discard patch within 12 hours or as directed by MD 02/04/21   Anselm Pancoast, PA-C  methocarbamol (ROBAXIN) 750 MG tablet Take 1 tablet (750 mg total) by mouth 2 (two) times daily as needed for muscle spasms (or muscle tightness). 02/04/21   Joy, Shawn C, PA-C  nicotine (NICODERM CQ) 14 mg/24hr patch Place 1 patch (14 mg total) onto the skin daily. 09/29/22   Shanna Cisco, NP  PARoxetine (PAXIL) 20 MG tablet Take 1 tablet (20 mg total) by mouth daily. 09/29/22   Shanna Cisco, NP  QUEtiapine (SEROQUEL) 100 MG tablet Take 1 tablet (100 mg total) by mouth at bedtime. 09/29/22   Shanna Cisco, NP    Physical Exam: Vitals:   05/24/23 1715 05/24/23 1916 05/24/23 2000 05/24/23 2100  BP:  106/68 102/73 103/76  Pulse:  83 74 83  Resp:  18 18 18   Temp:      TempSrc:      SpO2:  97% 97% 95%  Weight: 72.6 kg     Height: 6\' 1"  (1.854 m)        Constitutional: NAD, no pallor or diaphoresis   Eyes: PERTLA, lids and conjunctivae normal ENMT: Mucous membranes are moist. Posterior pharynx clear of any exudate or lesions.   Neck: supple, no masses  Respiratory: no wheezing, no crackles. No accessory muscle use.  Cardiovascular: S1 & S2 heard, regular rate and rhythm. No extremity edema.   Abdomen: No distension, no tenderness, soft. Bowel sounds active.  Musculoskeletal: no clubbing / cyanosis. No joint deformity upper and lower extremities.   Skin: no significant rashes, lesions, ulcers. Warm, dry, well-perfused. Neurologic: CN 2-12 grossly intact. Sensation intact. Strength 5/5 in all 4 limbs. Alert and oriented.  Psychiatric: Calm. Cooperative.    Labs and Imaging on Admission: I have personally reviewed following labs and imaging studies  CBC: Recent Labs  Lab  05/24/23 1816  WBC 9.8  NEUTROABS 5.9  HGB 12.6*  HCT 34.4*  MCV 83.7  PLT 218   Basic Metabolic Panel: Recent Labs  Lab 05/24/23 1816  NA 120*  K 3.4*  CL 85*  CO2 24  GLUCOSE 93  BUN 17  CREATININE 0.70  CALCIUM 7.9*   GFR: Estimated Creatinine Clearance: 113.4 mL/min (by C-G formula based on SCr of 0.7 mg/dL). Liver Function Tests: Recent Labs  Lab 05/24/23 1816  AST 60*  ALT 71*  ALKPHOS 82  BILITOT 1.0  PROT 5.7*  ALBUMIN 2.5*   Recent Labs  Lab 05/24/23 1816  LIPASE 86*   No results for input(s): "AMMONIA" in the last 168 hours. Coagulation Profile: No results for input(s): "INR", "PROTIME" in the last 168 hours. Cardiac Enzymes: No results for input(s): "CKTOTAL", "CKMB", "CKMBINDEX", "TROPONINI" in the last 168 hours. BNP (last 3 results) No results for input(s): "PROBNP" in the last 8760 hours. HbA1C: No results for input(s): "HGBA1C" in the last 72 hours. CBG: No results for input(s): "GLUCAP" in the last 168 hours. Lipid Profile: No results for input(s): "CHOL", "HDL", "LDLCALC", "TRIG", "CHOLHDL", "LDLDIRECT" in the last 72 hours. Thyroid Function Tests: No results for input(s): "TSH", "T4TOTAL", "FREET4", "T3FREE", "THYROIDAB" in the last 72 hours. Anemia Panel: No results for input(s): "VITAMINB12", "FOLATE", "FERRITIN", "TIBC", "IRON", "RETICCTPCT" in the last 72 hours. Urine analysis:    Component Value Date/Time   COLORURINE YELLOW 05/24/2023 1821   APPEARANCEUR CLEAR 05/24/2023 1821   LABSPEC 1.015 05/24/2023 1821   PHURINE 5.0 05/24/2023 1821   GLUCOSEU NEGATIVE 05/24/2023 1821   HGBUR NEGATIVE 05/24/2023 1821   BILIRUBINUR NEGATIVE 05/24/2023 1821   KETONESUR 20 (A) 05/24/2023 1821   PROTEINUR NEGATIVE 05/24/2023 1821   UROBILINOGEN 0.2 01/29/2008 2159   NITRITE NEGATIVE 05/24/2023 1821   LEUKOCYTESUR NEGATIVE 05/24/2023 1821   Sepsis Labs: @LABRCNTIP (procalcitonin:4,lacticidven:4) )No results found for this or any  previous visit (from the past 240 hour(s)).   Radiological Exams on Admission: CT ABDOMEN PELVIS W CONTRAST  Result Date: 05/24/2023 CLINICAL DATA:  Abdominal pain, headache, weakness EXAM: CT ABDOMEN AND PELVIS WITH CONTRAST TECHNIQUE: Multidetector CT imaging of the abdomen and pelvis was performed using the standard protocol following bolus administration of intravenous contrast. RADIATION DOSE REDUCTION: This exam was performed according to the departmental dose-optimization program which includes automated exposure control, adjustment of the mA and/or kV according to patient size and/or use of iterative reconstruction technique. CONTRAST:  OMNIPAQUE IOHEXOL 300 MG/ML  SOLN COMPARISON:  03/26/2015 FINDINGS: Lower chest: Mild dependent atelectasis in the bilateral lower lobes. Hepatobiliary: Liver is within normal limits. Gallbladder is unremarkable. No intrahepatic or extrahepatic duct dilatation. Pancreas: Within normal limits. Spleen: Within normal limits. Adrenals/Urinary Tract: Adrenal glands are within normal limits. Simple bilateral renal cysts, measuring up to 17 mm in the medial right upper kidney (series 7/image 19), benign (Bosniak I). No follow-up is recommended. Punctate nonobstructing left lower pole renal calculus (series  2/image 35). No hydronephrosis. Bladder is within normal limits. Stomach/Bowel: Stomach is within normal limits. No evidence of bowel obstruction. Appendix is not discretely visualized. No colonic wall thickening or inflammatory changes. Vascular/Lymphatic: No evidence of abdominal aortic aneurysm. Atherosclerotic calcifications of the abdominal aorta and branch vessels, although vessels remain patent. No suspicious abdominopelvic lymphadenopathy. Reproductive: Prostate is unremarkable. Other: No abdominopelvic ascites. Musculoskeletal: Mild degenerative changes at L4-5. IMPRESSION: No CT findings to account for the patient's abdominal pain. Punctate nonobstructing left  lower pole renal calculus. No hydronephrosis. Electronically Signed   By: Charline Bills M.D.   On: 05/24/2023 21:25    Assessment/Plan   1. Hyponatremia  - Serum sodium is 120 in setting of hypovolemia  - No severe symptoms  - Continue isotonic IVF hydration, follow serial sodium levels, and adjust fluids as needed   2. Abdominal pain, N/V/D  - Reports 4 days of N/V/D but no diarrhea today  - Exam is benign and no acute findings noted on CT  - Continue supportive care with IVF hydration and electrolyte correction, allow clears as tolerated, and advance diet as he improves    3. Elevated transaminases  - No acute hepatobiliary abnormality on CT; he denies EtOH or excessive APAP use  - Check viral hepatitis panel, trend LFTs    4. Hypokalemia  - Replace potassium, repeat chem panel    DVT prophylaxis: Lovenox  Code Status: Full  Level of Care: Level of care: Med-Surg Family Communication: none present  Disposition Plan:  Patient is from: home  Anticipated d/c is to: Home  Anticipated d/c date is: 05/26/23  Patient currently: Pending improved sodium, tolerance of adequate oral intake  Consults called: None  Admission status: Inpatient     Briscoe Deutscher, MD [facility]  05/24/2023, 9:56 PM

## 2023-05-24 NOTE — ED Notes (Signed)
Patient transported to CT 

## 2023-05-25 LAB — OSMOLALITY: Osmolality: 269 mOsm/kg — ABNORMAL LOW (ref 275–295)

## 2023-05-25 LAB — SODIUM, URINE, RANDOM: Sodium, Ur: 46 mmol/L

## 2023-05-25 LAB — OSMOLALITY, URINE: Osmolality, Ur: 643 mOsm/kg (ref 300–900)

## 2023-05-25 LAB — TSH: TSH: 2.368 u[IU]/mL (ref 0.350–4.500)

## 2023-05-25 MED ORDER — ACETAMINOPHEN 650 MG RE SUPP: 650.0000 mg | Freq: Four times a day (QID) | RECTAL | Status: DC | PRN

## 2023-05-25 MED ORDER — QUETIAPINE FUMARATE 100 MG PO TABS
100.0000 mg | ORAL_TABLET | Freq: Every day | ORAL | Status: DC
  Administered 2023-05-25 – 2023-05-28 (×4): 100 mg via ORAL
  Filled 2023-05-25 (×4): qty 1

## 2023-05-25 MED ORDER — ACETAMINOPHEN 325 MG PO TABS
650.0000 mg | ORAL_TABLET | Freq: Four times a day (QID) | ORAL | Status: DC | PRN
  Administered 2023-05-25 – 2023-05-26 (×3): 650 mg via ORAL
  Filled 2023-05-25 (×3): qty 2

## 2023-05-25 NOTE — Progress Notes (Signed)
PROGRESS NOTE    Zachary Hutchinson  ZOX:[id number] DOB: Aug 22, 1971 DOA: 05/24/2023 PCP: Pcp, No   Brief Narrative:    Zachary Hutchinson is a 52 year old male with medical history significant for substance abuse, anxiety, and scalp abscess that was drained on 05/06/2023, now presenting for evaluation of intermittent abdominal pain, nausea, vomiting, and diarrhea.  Patient was admitted with hyponatremia and started on IV normal saline with mild improvement noted.  Assessment & Plan:   Principal Problem:   Hyponatremia Active Problems:   Elevated transaminase level   Hypokalemia   Nausea vomiting and diarrhea  Assessment and Plan:   1. Hyponatremia  - Serum sodium is 120 in setting of hypovolemia, stable at 121 - No severe symptoms  - Continue isotonic IVF hydration, follow serial sodium levels, and adjust fluids as needed  -TSH within normal limits and osmolarity's and urine sodium pending   2. Abdominal pain, N/V/D  - Reports 4 days of N/V/D but no diarrhea today  - Exam is benign and no acute findings noted on CT  - Continue supportive care with IVF hydration and electrolyte correction, allow clears as tolerated, and advance diet as he improves     3. Elevated transaminases  - No acute hepatobiliary abnormality on CT; he denies EtOH or excessive APAP use  -Hepatitis panel nonreactive   4. Hypokalemia  - Replace potassium, repeat chem panel    DVT prophylaxis:Lovenox Code Status: Full Family Communication: None at bedside Disposition Plan: Admit for hyponatremia and dietary advancement Status is: Inpatient Remains inpatient appropriate because: Need for IV fluid.  Consultants:  None  Procedures:  None  Antimicrobials:  None   Subjective: Patient seen and evaluated today with no new acute complaints or concerns.  Noted to have complaints of headache this morning.  He states that he is hungry and would like to have a diet order.  Objective: Vitals:   05/25/23 1030  05/25/23 1045 05/25/23 1100 05/25/23 1115  BP: (!) 113/58 110/70 124/63 111/70  Pulse: 86 82 83 69  Resp: 12 (!) 24 (!) 25 (!) 7  Temp:      TempSrc:      SpO2: 95% 96% 94% 96%  Weight:      Height:        Intake/Output Summary (Last 24 hours) at 05/25/2023 1150 Last data filed at 05/25/2023 0855 Gross per 24 hour  Intake 1179.91 ml  Output 1000 ml  Net 179.91 ml   Filed Weights   05/24/23 1715  Weight: 72.6 kg    Examination:  General exam: Appears calm and comfortable  Respiratory system: Clear to auscultation. Respiratory effort normal. Cardiovascular system: S1 & S2 heard, RRR.  Gastrointestinal system: Abdomen is soft Central nervous system: Alert and awake Extremities: No edema Skin: No significant lesions noted Psychiatry: Flat affect.    Data Reviewed: I have personally reviewed following labs and imaging studies  CBC: Recent Labs  Lab 05/24/23 1816 05/25/23 0310  WBC 9.8 8.0  NEUTROABS 5.9  --   HGB 12.6* 12.1*  HCT 34.4* 33.6*  MCV 83.7 85.1  PLT 218 227   Basic Metabolic Panel: Recent Labs  Lab 05/24/23 1816 05/24/23 2157 05/25/23 0310 05/25/23 0944  NA 120* 122* 124* 121*  K 3.4*  --  3.3*  --   CL 85*  --  90*  --   CO2 24  --  24  --   GLUCOSE 93  --  87  --   BUN 17  --  14  --   CREATININE 0.70  --  0.67  --   CALCIUM 7.9*  --  7.7*  --   MG  --   --  2.0  --    GFR: Estimated Creatinine Clearance: 113.4 mL/min (by C-G formula based on SCr of 0.67 mg/dL). Liver Function Tests: Recent Labs  Lab 05/24/23 1816 05/25/23 0310  AST 60* 47*  ALT 71* 61*  ALKPHOS 82 75  BILITOT 1.0 0.8  PROT 5.7* 5.5*  ALBUMIN 2.5* 2.4*   Recent Labs  Lab 05/24/23 1816  LIPASE 86*   No results for input(s): "AMMONIA" in the last 168 hours. Coagulation Profile: No results for input(s): "INR", "PROTIME" in the last 168 hours. Cardiac Enzymes: No results for input(s): "CKTOTAL", "CKMB", "CKMBINDEX", "TROPONINI" in the last 168 hours. BNP  (last 3 results) No results for input(s): "PROBNP" in the last 8760 hours. HbA1C: No results for input(s): "HGBA1C" in the last 72 hours. CBG: No results for input(s): "GLUCAP" in the last 168 hours. Lipid Profile: No results for input(s): "CHOL", "HDL", "LDLCALC", "TRIG", "CHOLHDL", "LDLDIRECT" in the last 72 hours. Thyroid Function Tests: Recent Labs    05/25/23 0310  TSH 2.368   Anemia Panel: No results for input(s): "VITAMINB12", "FOLATE", "FERRITIN", "TIBC", "IRON", "RETICCTPCT" in the last 72 hours. Sepsis Labs: No results for input(s): "PROCALCITON", "LATICACIDVEN" in the last 168 hours.  No results found for this or any previous visit (from the past 240 hour(s)).       Radiology Studies: CT ABDOMEN PELVIS W CONTRAST  Result Date: 05/24/2023 CLINICAL DATA:  Abdominal pain, headache, weakness EXAM: CT ABDOMEN AND PELVIS WITH CONTRAST TECHNIQUE: Multidetector CT imaging of the abdomen and pelvis was performed using the standard protocol following bolus administration of intravenous contrast. RADIATION DOSE REDUCTION: This exam was performed according to the departmental dose-optimization program which includes automated exposure control, adjustment of the mA and/or kV according to patient size and/or use of iterative reconstruction technique. CONTRAST:  OMNIPAQUE IOHEXOL 300 MG/ML  SOLN COMPARISON:  03/26/2015 FINDINGS: Lower chest: Mild dependent atelectasis in the bilateral lower lobes. Hepatobiliary: Liver is within normal limits. Gallbladder is unremarkable. No intrahepatic or extrahepatic duct dilatation. Pancreas: Within normal limits. Spleen: Within normal limits. Adrenals/Urinary Tract: Adrenal glands are within normal limits. Simple bilateral renal cysts, measuring up to 17 mm in the medial right upper kidney (series 7/image 19), benign (Bosniak I). No follow-up is recommended. Punctate nonobstructing left lower pole renal calculus (series 2/image 35). No  hydronephrosis. Bladder is within normal limits. Stomach/Bowel: Stomach is within normal limits. No evidence of bowel obstruction. Appendix is not discretely visualized. No colonic wall thickening or inflammatory changes. Vascular/Lymphatic: No evidence of abdominal aortic aneurysm. Atherosclerotic calcifications of the abdominal aorta and branch vessels, although vessels remain patent. No suspicious abdominopelvic lymphadenopathy. Reproductive: Prostate is unremarkable. Other: No abdominopelvic ascites. Musculoskeletal: Mild degenerative changes at L4-5. IMPRESSION: No CT findings to account for the patient's abdominal pain. Punctate nonobstructing left lower pole renal calculus. No hydronephrosis. Electronically Signed   By: Charline Bills M.D.   On: 05/24/2023 21:25        Scheduled Meds:  enoxaparin (LOVENOX) injection  40 mg Subcutaneous Q24H   Continuous Infusions:  sodium chloride 125 mL/hr at 05/25/23 0845     LOS: 1 day    Time spent: 35 minutes    Mya Suell Hoover Brunette, DO [facility]  If 7PM-7AM, please contact night-coverage [email or web address] 05/25/2023, 11:50 AM

## 2023-05-25 NOTE — Progress Notes (Signed)
Pt admitted due to hyponatremia. Noted pt has no insurance and no PCP. Pt reports he lives with his mother in [place]. LCSW encouraged pt to follow up with [facility]. No other needs reported. TOC will follow.    05/25/23 1102  TOC Brief Assessment  Insurance and Status Lapsed (No insurance)  Patient has primary care physician No (Referred to [facility])  Home environment has been reviewed Lives with mother.  Prior level of function: Independent  Prior/Current Home Services No current home services  Social Determinants of Health Reivew SDOH reviewed no interventions necessary  Readmission risk has been reviewed Yes  Transition of care needs transition of care needs identified, TOC will continue to follow (Referred to health department)

## 2023-05-25 NOTE — Progress Notes (Signed)
Patient has denies nausea/vomiting/diarrhea. He has ate and drank without complaint. He has complained of headache and was medicated X2 for this since arriving to this unit.

## 2023-05-26 ENCOUNTER — Inpatient Hospital Stay (HOSPITAL_COMMUNITY): Payer: Self-pay

## 2023-05-26 MED ORDER — SODIUM CHLORIDE 1 G PO TABS
1.0000 g | ORAL_TABLET | Freq: Two times a day (BID) | ORAL | Status: DC
  Administered 2023-05-26 – 2023-05-29 (×6): 1 g via ORAL
  Filled 2023-05-26 (×6): qty 1

## 2023-05-26 MED ORDER — POTASSIUM CHLORIDE CRYS ER 20 MEQ PO TBCR
40.0000 meq | EXTENDED_RELEASE_TABLET | Freq: Two times a day (BID) | ORAL | Status: AC
  Administered 2023-05-26 (×2): 40 meq via ORAL
  Filled 2023-05-26 (×2): qty 2

## 2023-05-26 NOTE — Progress Notes (Signed)
PROGRESS NOTE    Zachary Hutchinson  UYQ:[id number] DOB: 16-Mar-1971 DOA: 05/24/2023 PCP: Pcp, No   Brief Narrative:    Zachary Hutchinson is a 52 year old male with medical history significant for substance abuse, anxiety, and scalp abscess that was drained on 05/06/2023, now presenting for evaluation of intermittent abdominal pain, nausea, vomiting, and diarrhea.  Patient was admitted with hyponatremia and started on IV normal saline with mild improvement noted.  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  Assessment & Plan:   Principal Problem:   Hyponatremia Active Problems:   Elevated transaminase level   Hypokalemia   Nausea vomiting and diarrhea  Assessment and Plan:   1. Hyponatremia secondary to SIADH - Serum sodium is 120 in setting of hypovolemia, stable at 125 - No severe symptoms  -Continue IV fluid and placed on fluid restriction and start sodium chloride tablets -TSH within normal limits -Chest x-ray ordered   2. Abdominal pain, N/V/D  - Reports 4 days of N/V/D but no diarrhea today  - Exam is benign and no acute findings noted on CT  - Continue supportive care with IVF hydration and electrolyte correction, allow clears as tolerated, and advance diet as he improves     3. Elevated transaminases  - No acute hepatobiliary abnormality on CT; he denies EtOH or excessive APAP use  -Hepatitis panel nonreactive   4. Hypokalemia  - Replace potassium, repeat chem panel    DVT prophylaxis:Lovenox Code Status: Full Family Communication: None at bedside Disposition Plan: Admit for hyponatremia and dietary advancement Status is: Inpatient Remains inpatient appropriate because: Need for further sodium correction.  Consultants:  None  Procedures:  None  Antimicrobials:  None   Subjective: Patient seen and evaluated today with no new acute complaints or concerns.  He denies any further nausea, vomiting, or diarrhea and sodium levels are  slowly improving.  Objective: Vitals:   05/25/23 1200 05/25/23 2043 05/26/23 0430 05/26/23 0500  BP: 115/75 (!) 89/60 107/67   Pulse: 77 87 96   Resp: 16 18 19    Temp: 98.9 F (37.2 C) 98.7 F (37.1 C) 99.2 F (37.3 C)   TempSrc: Oral Oral Oral   SpO2: 97% 98% 95%   Weight: 68.4 kg   68.4 kg  Height: 6\' 1"  (1.854 m)       Intake/Output Summary (Last 24 hours) at 05/26/2023 1138 Last data filed at 05/25/2023 1700 Gross per 24 hour  Intake 240 ml  Output --  Net 240 ml   Filed Weights   05/24/23 1715 05/25/23 1200 05/26/23 0500  Weight: 72.6 kg 68.4 kg 68.4 kg    Examination:  General exam: Appears calm and comfortable  Respiratory system: Clear to auscultation. Respiratory effort normal. Cardiovascular system: S1 & S2 heard, RRR.  Gastrointestinal system: Abdomen is soft Central nervous system: Alert and awake Extremities: No edema Skin: No significant lesions noted Psychiatry: Flat affect.    Data Reviewed: I have personally reviewed following labs and imaging studies  CBC: Recent Labs  Lab 05/24/23 1816 05/25/23 0310 05/26/23 0427  WBC 9.8 8.0 9.4  NEUTROABS 5.9  --   --   HGB 12.6* 12.1* 11.7*  HCT 34.4* 33.6* 32.4*  MCV 83.7 85.1 85.0  PLT 218 227 295   Basic Metabolic Panel: Recent Labs  Lab 05/24/23 1816 05/24/23 2157 05/25/23 0310 05/25/23 0944 05/25/23 1608 05/26/23 0427  NA 120* 122* 124* 121* 124* 125*  K 3.4*  --  3.3*  --   --  3.3*  CL 85*  --  90*  --   --  93*  CO2 24  --  24  --   --  26  GLUCOSE 93  --  87  --   --  101*  BUN 17  --  14  --   --  9  CREATININE 0.70  --  0.67  --   --  0.60*  CALCIUM 7.9*  --  7.7*  --   --  7.6*  MG  --   --  2.0  --   --  2.0   GFR: Estimated Creatinine Clearance: 106.9 mL/min (A) (by C-G formula based on SCr of 0.6 mg/dL (L)). Liver Function Tests: Recent Labs  Lab 05/24/23 1816 05/25/23 0310 05/26/23 0427  AST 60* 47* 49*  ALT 71* 61* 52*  ALKPHOS 82 75 62  BILITOT 1.0 0.8 0.4   PROT 5.7* 5.5* 5.2*  ALBUMIN 2.5* 2.4* 2.3*   Recent Labs  Lab 05/24/23 1816  LIPASE 86*   No results for input(s): "AMMONIA" in the last 168 hours. Coagulation Profile: No results for input(s): "INR", "PROTIME" in the last 168 hours. Cardiac Enzymes: No results for input(s): "CKTOTAL", "CKMB", "CKMBINDEX", "TROPONINI" in the last 168 hours. BNP (last 3 results) No results for input(s): "PROBNP" in the last 8760 hours. HbA1C: No results for input(s): "HGBA1C" in the last 72 hours. CBG: No results for input(s): "GLUCAP" in the last 168 hours. Lipid Profile: No results for input(s): "CHOL", "HDL", "LDLCALC", "TRIG", "CHOLHDL", "LDLDIRECT" in the last 72 hours. Thyroid Function Tests: Recent Labs    05/25/23 0310  TSH 2.368   Anemia Panel: No results for input(s): "VITAMINB12", "FOLATE", "FERRITIN", "TIBC", "IRON", "RETICCTPCT" in the last 72 hours. Sepsis Labs: No results for input(s): "PROCALCITON", "LATICACIDVEN" in the last 168 hours.  No results found for this or any previous visit (from the past 240 hour(s)).       Radiology Studies: CT ABDOMEN PELVIS W CONTRAST  Result Date: 05/24/2023 CLINICAL DATA:  Abdominal pain, headache, weakness EXAM: CT ABDOMEN AND PELVIS WITH CONTRAST TECHNIQUE: Multidetector CT imaging of the abdomen and pelvis was performed using the standard protocol following bolus administration of intravenous contrast. RADIATION DOSE REDUCTION: This exam was performed according to the departmental dose-optimization program which includes automated exposure control, adjustment of the mA and/or kV according to patient size and/or use of iterative reconstruction technique. CONTRAST:  OMNIPAQUE IOHEXOL 300 MG/ML  SOLN COMPARISON:  03/26/2015 FINDINGS: Lower chest: Mild dependent atelectasis in the bilateral lower lobes. Hepatobiliary: Liver is within normal limits. Gallbladder is unremarkable. No intrahepatic or extrahepatic duct dilatation. Pancreas:  Within normal limits. Spleen: Within normal limits. Adrenals/Urinary Tract: Adrenal glands are within normal limits. Simple bilateral renal cysts, measuring up to 17 mm in the medial right upper kidney (series 7/image 19), benign (Bosniak I). No follow-up is recommended. Punctate nonobstructing left lower pole renal calculus (series 2/image 35). No hydronephrosis. Bladder is within normal limits. Stomach/Bowel: Stomach is within normal limits. No evidence of bowel obstruction. Appendix is not discretely visualized. No colonic wall thickening or inflammatory changes. Vascular/Lymphatic: No evidence of abdominal aortic aneurysm. Atherosclerotic calcifications of the abdominal aorta and branch vessels, although vessels remain patent. No suspicious abdominopelvic lymphadenopathy. Reproductive: Prostate is unremarkable. Other: No abdominopelvic ascites. Musculoskeletal: Mild degenerative changes at L4-5. IMPRESSION: No CT findings to account for the patient's abdominal pain. Punctate nonobstructing left lower pole renal calculus. No hydronephrosis. Electronically  Signed   By: Charline Bills M.D.   On: 05/24/2023 21:25        Scheduled Meds:  enoxaparin (LOVENOX) injection  40 mg Subcutaneous Q24H   potassium chloride  40 mEq Oral BID   QUEtiapine  100 mg Oral QHS   sodium chloride  1 g Oral BID WC       LOS: 2 days    Time spent: 35 minutes    Suzie Vandam Hoover Brunette, DO [facility]  If 7PM-7AM, please contact night-coverage [email or web address] 05/26/2023, 11:38 AM

## 2023-05-26 NOTE — Plan of Care (Signed)
Patient slept throughout the night. Vital signs stable. Only c/o headache once at beginning of shift. Denies nausea or vomiting. Able to eat and drink safely without incident. No issues this shift.  Problem: Education: Goal: Knowledge of General Education information will improve Description: Including pain rating scale, medication(s)/side effects and non-pharmacologic comfort measures Outcome: Progressing   Problem: Health Behavior/Discharge Planning: Goal: Ability to manage health-related needs will improve Outcome: Progressing   Problem: Clinical Measurements: Goal: Ability to maintain clinical measurements within normal limits will improve Outcome: Progressing Goal: Will remain free from infection Outcome: Progressing Goal: Diagnostic test results will improve Outcome: Progressing Goal: Respiratory complications will improve Outcome: Progressing Goal: Cardiovascular complication will be avoided Outcome: Progressing   Problem: Activity: Goal: Risk for activity intolerance will decrease Outcome: Progressing   Problem: Nutrition: Goal: Adequate nutrition will be maintained Outcome: Progressing   Problem: Coping: Goal: Level of anxiety will decrease Outcome: Progressing   Problem: Elimination: Goal: Will not experience complications related to bowel motility Outcome: Progressing Goal: Will not experience complications related to urinary retention Outcome: Progressing   Problem: Pain Managment: Goal: General experience of comfort will improve Outcome: Progressing   Problem: Safety: Goal: Ability to remain free from injury will improve Outcome: Progressing   Problem: Skin Integrity: Goal: Risk for impaired skin integrity will decrease Outcome: Progressing

## 2023-05-27 NOTE — Progress Notes (Signed)
PROGRESS NOTE    Zachary Hutchinson  ZDG:[id number] DOB: 1971/06/28 DOA: 05/24/2023 PCP: Pcp, No   Brief Narrative:    Zachary Hutchinson is a 52 year old male with medical history significant for substance abuse, anxiety, and scalp abscess that was drained on 05/06/2023, now presenting for evaluation of intermittent abdominal pain, nausea, vomiting, and diarrhea.  Patient was admitted with hyponatremia and started on IV normal saline with mild improvement noted.  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  Assessment & Plan:   Principal Problem:   Hyponatremia Active Problems:   Elevated transaminase level   Hypokalemia   Nausea vomiting and diarrhea  Assessment and Plan:   1. 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 Abdominal pain, N/V/D  - Reports 4 days of N/V/D but no diarrhea today  - Exam is benign and no acute findings noted on CT  - Continue supportive care and pt still has nausea   3. Elevated transaminases  - No acute hepatobiliary abnormality on CT; he denies EtOH or excessive APAP use  -Hepatitis panel nonreactive -Continues to upward trend; monitor    DVT prophylaxis:Lovenox Code Status: Full Family Communication: None at bedside Disposition Plan: Admit for hyponatremia and dietary advancement Status is: Inpatient Remains inpatient appropriate because: Need for further sodium correction.  Consultants:  None  Procedures:  None  Antimicrobials:  None   Subjective: Patient seen and evaluated today with no new acute complaints or concerns.  He has worsening nausea this morning, but sodium levels are improving.  Objective: Vitals:   05/26/23 1333 05/26/23 2040  05/27/23 0405 05/27/23 0500  BP: 108/77 114/78 115/85   Pulse: 85 80 89   Resp: 18 18 18    Temp: 98.5 F (36.9 C) 98.7 F (37.1 C) 98.5 F (36.9 C)   TempSrc: Oral Oral Oral   SpO2: 97% 97% 98%   Weight:    65.4 kg  Height:        Intake/Output Summary (Last 24 hours) at 05/27/2023 1108 Last data filed at 05/27/2023 0900 Gross per 24 hour  Intake 720 ml  Output 2300 ml  Net -1580 ml   Filed Weights   05/25/23 1200 05/26/23 0500 05/27/23 0500  Weight: 68.4 kg 68.4 kg 65.4 kg    Examination:  General exam: Appears calm and comfortable  Respiratory system: Clear to auscultation. Respiratory effort normal. Cardiovascular system: S1 & S2 heard, RRR.  Gastrointestinal system: Abdomen is soft Central nervous system: Alert and awake Extremities: No edema Skin: No significant lesions noted Psychiatry: Flat affect.    Data Reviewed: I have personally reviewed following labs and imaging studies  CBC: Recent Labs  Lab 05/24/23 1816 05/25/23 0310 05/26/23 0427 05/27/23 0420  WBC 9.8 8.0 9.4 13.9*  NEUTROABS 5.9  --   --   --   HGB 12.6* 12.1* 11.7* 12.2*  HCT 34.4* 33.6* 32.4* 35.5*  MCV 83.7 85.1 85.0 87.7  PLT 218 227 295 347   Basic Metabolic Panel: Recent Labs  Lab 05/24/23 1816 05/24/23 2157 05/25/23 0310 05/25/23 0944 05/25/23 1608 05/26/23 0427 05/27/23 0420  NA 120*   < > 124* 121* 124* 125* 130*  K 3.4*  --  3.3*  --   --  3.3* 4.2  CL 85*  --  90*  --   --  93* 97*  CO2 24  --  24  --   --  26 27  GLUCOSE 93  --  87  --   --  101* 105*  BUN 17  --  14  --   --  9 10  CREATININE 0.70  --  0.67  --   --  0.60* 0.69  CALCIUM 7.9*  --  7.7*  --   --  7.6* 8.0*  MG  --   --  2.0  --   --  2.0 2.2   < > = values in this interval not displayed.   GFR: Estimated Creatinine Clearance: 102.2 mL/min (by C-G formula based on SCr of 0.69 mg/dL). Liver Function Tests: Recent Labs  Lab 05/24/23 1816 05/25/23 0310 05/26/23 0427 05/27/23 0420  AST 60*  47* 49* 117*  ALT 71* 61* 52* 83*  ALKPHOS 82 75 62 65  BILITOT 1.0 0.8 0.4 0.2*  PROT 5.7* 5.5* 5.2* 5.6*  ALBUMIN 2.5* 2.4* 2.3* 2.5*   Recent Labs  Lab 05/24/23 1816  LIPASE 86*   No results for input(s): "AMMONIA" in the last 168 hours. Coagulation Profile: No results for input(s): "INR", "PROTIME" in the last 168 hours. Cardiac Enzymes: No results for input(s): "CKTOTAL", "CKMB", "CKMBINDEX", "TROPONINI" in the last 168 hours. BNP (last 3 results) No results for input(s): "PROBNP" in the last 8760 hours. HbA1C: No results for input(s): "HGBA1C" in the last 72 hours. CBG: No results for input(s): "GLUCAP" in the last 168 hours. Lipid Profile: No results for input(s): "CHOL", "HDL", "LDLCALC", "TRIG", "CHOLHDL", "LDLDIRECT" in the last 72 hours. Thyroid Function Tests: Recent Labs    05/25/23 0310  TSH 2.368   Anemia Panel: No results for input(s): "VITAMINB12", "FOLATE", "FERRITIN", "TIBC", "IRON", "RETICCTPCT" in the last 72 hours. Sepsis Labs: No results for input(s): "PROCALCITON", "LATICACIDVEN" in the last 168 hours.  No results found for this or any previous visit (from the past 240 hour(s)).       Radiology Studies: DG CHEST PORT 1 VIEW  Result Date: 05/26/2023 CLINICAL DATA:  Cough.  Smoker. EXAM: PORTABLE CHEST 1 VIEW COMPARISON:  None Available. FINDINGS: Heart size is normal. No pleural fluid or interstitial edema. Lungs are hyperinflated with coarsened interstitial markings. No superimposed airspace consolidation. Visualized osseous structures are unremarkable. IMPRESSION: 1. No acute findings. 2. Hyperinflation with coarsened interstitial markings suggestive of COPD. Electronically Signed   By: Signa Kell M.D.   On: 05/26/2023 13:29        Scheduled Meds:  enoxaparin (LOVENOX) injection  40 mg Subcutaneous Q24H   QUEtiapine  100 mg Oral QHS   sodium chloride  1 g Oral BID WC       LOS: 3 days    Time spent: 35 minutes    Kendra Woolford Hoover Brunette, DO [facility]  If 7PM-7AM, please contact night-coverage [email or web address] 05/27/2023, 11:08 AM

## 2023-05-27 NOTE — Plan of Care (Signed)

## 2023-05-28 ENCOUNTER — Inpatient Hospital Stay (HOSPITAL_COMMUNITY): Payer: Self-pay

## 2023-05-28 LAB — COMPREHENSIVE METABOLIC PANEL
ALT: 106 U/L — ABNORMAL HIGH (ref 0–44)
AST: 122 U/L — ABNORMAL HIGH (ref 15–41)
Albumin: 2.6 g/dL — ABNORMAL LOW (ref 3.5–5.0)
Alkaline Phosphatase: 67 U/L (ref 38–126)
Anion gap: 5 (ref 5–15)
BUN: 11 mg/dL (ref 6–20)
CO2: 29 mmol/L (ref 22–32)
Calcium: 8.2 mg/dL — ABNORMAL LOW (ref 8.9–10.3)
Chloride: 97 mmol/L — ABNORMAL LOW (ref 98–111)
Creatinine, Ser: 0.55 mg/dL — ABNORMAL LOW (ref 0.61–1.24)
GFR, Estimated: >60 mL/min (ref >60)
Glucose, Bld: 105 mg/dL — ABNORMAL HIGH (ref 70–99)
Potassium: 4.2 mmol/L (ref 3.5–5.1)
Sodium: 131 mmol/L — ABNORMAL LOW (ref 135–145)
Total Bilirubin: 0.3 mg/dL (ref 0.3–1.2)
Total Protein: 5.7 g/dL — ABNORMAL LOW (ref 6.5–8.1)

## 2023-05-28 LAB — CBC
HCT: 35.6 % — ABNORMAL LOW (ref 39.0–52.0)
Hemoglobin: 12 g/dL — ABNORMAL LOW (ref 13.0–17.0)
MCH: 29.8 pg (ref 26.0–34.0)
MCHC: 33.7 g/dL (ref 30.0–36.0)
MCV: 88.3 fL (ref 80.0–100.0)
Platelets: 435 10*3/uL — ABNORMAL HIGH (ref 150–400)
RBC: 4.03 MIL/uL — ABNORMAL LOW (ref 4.22–5.81)
RDW: 14.6 % (ref 11.5–15.5)
WBC: 15.8 10*3/uL — ABNORMAL HIGH (ref 4.0–10.5)
nRBC: 0 % (ref 0.0–0.2)

## 2023-05-28 LAB — LIPASE, BLOOD: Lipase: 45 U/L (ref 11–51)

## 2023-05-28 LAB — MAGNESIUM: Magnesium: 2.2 mg/dL (ref 1.7–2.4)

## 2023-05-28 NOTE — Progress Notes (Signed)
Medicated for nausea x2 this shift and complaints of head pain x1. He states that the pain in his head is located at the same spot where he had an abscess drained in July of this year. No abscess noted at this time. No other events noted on this shift.

## 2023-05-28 NOTE — Progress Notes (Signed)
PROGRESS NOTE    Zachary Hutchinson  HKV:[id number] DOB: 08/31/1971 DOA: 05/24/2023 PCP: Pcp, No   Brief Narrative:    Zachary Hutchinson is a 52 year old male with medical history significant for substance abuse, anxiety, and scalp abscess that was drained on 05/06/2023, now presenting for evaluation of intermittent abdominal pain, nausea, vomiting, and diarrhea.  Patient was admitted with hyponatremia and started on IV normal saline with mild improvement noted.  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  He is noted to have elevating LFT levels.  Assessment & Plan:   Principal Problem:   Hyponatremia Active Problems:   Elevated transaminase level   Hypokalemia   Nausea vomiting and diarrhea  Assessment and Plan:   1. 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 Abdominal pain, N/V/D  - Reports 4 days of N/V/D but no diarrhea today  - Exam is benign and no acute findings noted on CT  - Continue supportive care and pt still has nausea intermittently   3. Elevated transaminases  - No acute hepatobiliary abnormality on CT; he denies EtOH or excessive APAP use  -Hepatitis panel nonreactive -Lipase 48 -Check ultrasound right upper quadrant today -Monitor repeat labs in a.m. -May need to consider GI evaluation if negative    DVT prophylaxis:Lovenox Code Status: Full Family Communication: None at bedside Disposition Plan: Admit for hyponatremia and dietary advancement Status is: Inpatient Remains inpatient appropriate because: Need for further sodium correction.  Consultants:  None  Procedures:  None  Antimicrobials:  None   Subjective: Patient seen and evaluated today with no new  acute complaints or concerns.  He denies any abdominal pain and states nausea is improved.  Sodium levels are slowly improving, however LFTs are elevating.  Objective: Vitals:   05/27/23 0500 05/27/23 1320 05/27/23 2116 05/28/23 0507  BP:  127/85 111/81 114/88  Pulse:  74 81 80  Resp:  18 16 18   Temp:  98.6 F (37 C) 98 F (36.7 C) 97.9 F (36.6 C)  TempSrc:  Oral Oral Oral  SpO2:  98% 100% 97%  Weight: 65.4 kg   63.5 kg  Height:        Intake/Output Summary (Last 24 hours) at 05/28/2023 1117 Last data filed at 05/28/2023 0900 Gross per 24 hour  Intake 840 ml  Output 950 ml  Net -110 ml   Filed Weights   05/26/23 0500 05/27/23 0500 05/28/23 0507  Weight: 68.4 kg 65.4 kg 63.5 kg    Examination:  General exam: Appears calm and comfortable  Respiratory system: Clear to auscultation. Respiratory effort normal. Cardiovascular system: S1 & S2 heard, RRR.  Gastrointestinal system: Abdomen is soft Central nervous system: Alert and awake Extremities: No edema Skin: No significant lesions noted Psychiatry: Flat affect.    Data Reviewed: I have personally reviewed following labs and imaging studies  CBC: Recent Labs  Lab 05/24/23 1816 05/25/23 0310 05/26/23 0427 05/27/23 0420 05/28/23 0446  WBC 9.8 8.0 9.4 13.9* 15.8*  NEUTROABS 5.9  --   --   --   --   HGB 12.6* 12.1* 11.7* 12.2* 12.0*  HCT 34.4* 33.6* 32.4* 35.5* 35.6*  MCV 83.7 85.1 85.0 87.7 88.3  PLT 218 227 295  347 435*   Basic Metabolic Panel: Recent Labs  Lab 05/24/23 1816 05/24/23 2157 05/25/23 0310 05/25/23 0944 05/25/23 1608 05/26/23 0427 05/27/23 0420 05/28/23 0446  NA 120*   < > 124* 121* 124* 125* 130* 131*  K 3.4*  --  3.3*  --   --  3.3* 4.2 4.2  CL 85*  --  90*  --   --  93* 97* 97*  CO2 24  --  24  --   --  26 27 29   GLUCOSE 93  --  87  --   --  101* 105* 105*  BUN 17  --  14  --   --  9 10 11   CREATININE 0.70  --  0.67  --   --  0.60* 0.69 0.55*  CALCIUM 7.9*  --  7.7*  --   --  7.6*  8.0* 8.2*  MG  --   --  2.0  --   --  2.0 2.2 2.2   < > = values in this interval not displayed.   GFR: Estimated Creatinine Clearance: 99.2 mL/min (A) (by C-G formula based on SCr of 0.55 mg/dL (L)). Liver Function Tests: Recent Labs  Lab 05/24/23 1816 05/25/23 0310 05/26/23 0427 05/27/23 0420 05/28/23 0446  AST 60* 47* 49* 117* 122*  ALT 71* 61* 52* 83* 106*  ALKPHOS 82 75 62 65 67  BILITOT 1.0 0.8 0.4 0.2* 0.3  PROT 5.7* 5.5* 5.2* 5.6* 5.7*  ALBUMIN 2.5* 2.4* 2.3* 2.5* 2.6*   Recent Labs  Lab 05/24/23 1816 05/28/23 0446  LIPASE 86* 45   No results for input(s): "AMMONIA" in the last 168 hours. Coagulation Profile: No results for input(s): "INR", "PROTIME" in the last 168 hours. Cardiac Enzymes: No results for input(s): "CKTOTAL", "CKMB", "CKMBINDEX", "TROPONINI" in the last 168 hours. BNP (last 3 results) No results for input(s): "PROBNP" in the last 8760 hours. HbA1C: No results for input(s): "HGBA1C" in the last 72 hours. CBG: No results for input(s): "GLUCAP" in the last 168 hours. Lipid Profile: No results for input(s): "CHOL", "HDL", "LDLCALC", "TRIG", "CHOLHDL", "LDLDIRECT" in the last 72 hours. Thyroid Function Tests: No results for input(s): "TSH", "T4TOTAL", "FREET4", "T3FREE", "THYROIDAB" in the last 72 hours.  Anemia Panel: No results for input(s): "VITAMINB12", "FOLATE", "FERRITIN", "TIBC", "IRON", "RETICCTPCT" in the last 72 hours. Sepsis Labs: No results for input(s): "PROCALCITON", "LATICACIDVEN" in the last 168 hours.  No results found for this or any previous visit (from the past 240 hour(s)).       Radiology Studies: US Abdomen Limited RUQ (LIVER/GB)  Result Date: 05/28/2023 CLINICAL DATA:  Transaminitis EXAM: ULTRASOUND ABDOMEN LIMITED RIGHT UPPER QUADRANT COMPARISON:  CT 05/24/2023 FINDINGS: Gallbladder: Distended gallbladder. No shadowing stones. No wall thickening or adjacent fluid. Common bile duct: Diameter: 6 mm.  Upper limits of  normal for patient's age. Liver: No focal lesion identified. Within normal limits in parenchymal echogenicity. Portal vein is patent on color Doppler imaging with normal direction of blood flow towards the liver. Other: None. IMPRESSION: No gallstones or ductal dilatation. Electronically Signed   By: Karen Kays M.D.   On: 05/28/2023 11:03        Scheduled Meds:  enoxaparin (LOVENOX) injection  40 mg Subcutaneous Q24H   QUEtiapine  100 mg Oral QHS   sodium chloride  1 g Oral BID WC       LOS: 4 days    Time spent: 35 minutes    Lakeeta Dobosz Hoover Brunette, DO [facility]  If 7PM-7AM, please contact night-coverage [email or web address] 05/28/2023, 11:17 AM

## 2023-05-29 DIAGNOSIS — R197 Diarrhea, unspecified: Secondary | ICD-10-CM

## 2023-05-29 LAB — COMPREHENSIVE METABOLIC PANEL
ALT: 112 U/L — ABNORMAL HIGH (ref 0–44)
AST: 114 U/L — ABNORMAL HIGH (ref 15–41)
Albumin: 2.7 g/dL — ABNORMAL LOW (ref 3.5–5.0)
Alkaline Phosphatase: 68 U/L (ref 38–126)
Anion gap: 5 (ref 5–15)
BUN: 12 mg/dL (ref 6–20)
CO2: 30 mmol/L (ref 22–32)
Calcium: 8.4 mg/dL — ABNORMAL LOW (ref 8.9–10.3)
Chloride: 96 mmol/L — ABNORMAL LOW (ref 98–111)
Creatinine, Ser: 0.6 mg/dL — ABNORMAL LOW (ref 0.61–1.24)
GFR, Estimated: >60 mL/min (ref >60)
Glucose, Bld: 101 mg/dL — ABNORMAL HIGH (ref 70–99)
Potassium: 4.5 mmol/L (ref 3.5–5.1)
Sodium: 131 mmol/L — ABNORMAL LOW (ref 135–145)
Total Bilirubin: 0.6 mg/dL (ref 0.3–1.2)
Total Protein: 6 g/dL — ABNORMAL LOW (ref 6.5–8.1)

## 2023-05-29 LAB — CBC
HCT: 36.2 % — ABNORMAL LOW (ref 39.0–52.0)
Hemoglobin: 12.5 g/dL — ABNORMAL LOW (ref 13.0–17.0)
MCH: 30.6 pg (ref 26.0–34.0)
MCHC: 34.5 g/dL (ref 30.0–36.0)
MCV: 88.5 fL (ref 80.0–100.0)
Platelets: 532 10*3/uL — ABNORMAL HIGH (ref 150–400)
RBC: 4.09 MIL/uL — ABNORMAL LOW (ref 4.22–5.81)
RDW: 14.9 % (ref 11.5–15.5)
WBC: 14.7 10*3/uL — ABNORMAL HIGH (ref 4.0–10.5)
nRBC: 0 % (ref 0.0–0.2)

## 2023-05-29 LAB — MAGNESIUM: Magnesium: 2.2 mg/dL (ref 1.7–2.4)

## 2023-05-29 MED ORDER — SODIUM CHLORIDE 1 G PO TABS: 1.0000 g | ORAL_TABLET | Freq: Two times a day (BID) | ORAL | 1 refills | Status: AC

## 2023-05-29 NOTE — Discharge Summary (Signed)
Physician Discharge Summary   Patient: Zachary Hutchinson MRN: [id number] DOB: 1970-12-22  Admit date:     05/24/2023  Discharge date: 05/29/23  Discharge Physician: Arnetha Courser   PCP: Pcp, No   Recommendations at discharge:  Please obtain CBC and CMP in 1 week Patient is being discharged with some salt tablets, please monitor sodium Patient need continuation of counseling for alcohol cessation Follow-up with primary care provider within a week  Discharge Diagnoses: Principal Problem:   Hyponatremia Active Problems:   Elevated transaminase level   Hypokalemia   Nausea and vomiting   Diarrhea   Hospital Course: Zachary Hutchinson is a 52 year old male with medical history significant for substance abuse, anxiety, and scalp abscess that was drained on 05/06/2023, now presenting for evaluation of intermittent abdominal pain, nausea, vomiting, and diarrhea.  Patient was admitted with hyponatremia and started on IV normal saline with mild improvement noted.  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  Nausea, vomiting and diarrhea has been improved.  Tolerating diet well.  Leukocytosis improving, no other obvious infection, likely reactive secondary to gastroenteritis.  He was found to have transaminitis, no significant abnormality noted on CT abdomen.  Hepatitis panel was negative.  Lipase normal at 48.  Right upper quadrant ultrasound was negative for any liver or gallbladder abnormality.  He is being discharged with salt tablets, he can use extra salt if insurance does not allow.  Patient need to stay away from illicit drugs and alcohol.  Counseling was again provided.  Patient will continue on current medications and need to have a close follow-up with his primary care provider for further recommendations.  Consultants: None Procedures performed: None Disposition: Home Diet recommendation:   Discharge Diet Orders (From admission, onward)     Start     Ordered   05/29/23 0000  Diet - low sodium heart healthy        05/29/23 1324           Regular diet DISCHARGE MEDICATION: Allergies as of 05/29/2023   No Known Allergies      Medication List     STOP taking these medications    doxycycline 100 MG capsule Commonly known as: VIBRAMYCIN       TAKE these medications    aspirin-acetaminophen-caffeine 250-250-65 MG tablet Commonly known as: EXCEDRIN MIGRAINE Take 2 tablets by mouth every 6 (six) hours as needed for headache.   nicotine 14 mg/24hr patch Commonly known as: Nicoderm CQ Place 1 patch (14 mg total) onto the skin daily.   PARoxetine 20 MG tablet Commonly known as: PAXIL Take 1 tablet (20 mg total) by mouth daily.   QUEtiapine 100 MG tablet Commonly known as: SEROQUEL Take 1 tablet (100 mg total) by mouth at bedtime.   sodium chloride 1 g tablet Take 1 tablet (1 g total) by mouth 2 (two) times daily with a meal.        Discharge Exam: Filed Weights   05/27/23 0500 05/28/23 0507 05/29/23 0437  Weight: 65.4 kg 63.5 kg 62.9 kg   General.     In no acute distress. Pulmonary.  Lungs clear bilaterally, normal respiratory effort. CV.  Regular rate and rhythm, no JVD, rub or murmur. Abdomen.  Soft, nontender, nondistended, BS positive. CNS.  Alert and oriented .  No focal neurologic deficit. Extremities.  No edema, no cyanosis, pulses intact and symmetrical. Psychiatry.  Judgment  and insight appears normal.   Condition at discharge: stable  The results of significant diagnostics from this hospitalization (including imaging, microbiology, ancillary and laboratory) are listed below for reference.   Imaging Studies: US Abdomen Limited RUQ (LIVER/GB)  Result Date: 05/28/2023 CLINICAL DATA:  Transaminitis EXAM: ULTRASOUND ABDOMEN LIMITED RIGHT UPPER QUADRANT COMPARISON:  CT 05/24/2023 FINDINGS: Gallbladder: Distended gallbladder. No  shadowing stones. No wall thickening or adjacent fluid. Common bile duct: Diameter: 6 mm.  Upper limits of normal for patient's age. Liver: No focal lesion identified. Within normal limits in parenchymal echogenicity. Portal vein is patent on color Doppler imaging with normal direction of blood flow towards the liver. Other: None. IMPRESSION: No gallstones or ductal dilatation. Electronically Signed   By: Karen Kays M.D.   On: 05/28/2023 11:03   DG CHEST PORT 1 VIEW  Result Date: 05/26/2023 CLINICAL DATA:  Cough.  Smoker. EXAM: PORTABLE CHEST 1 VIEW COMPARISON:  None Available. FINDINGS: Heart size is normal. No pleural fluid or interstitial edema. Lungs are hyperinflated with coarsened interstitial markings. No superimposed airspace consolidation. Visualized osseous structures are unremarkable. IMPRESSION: 1. No acute findings. 2. Hyperinflation with coarsened interstitial markings suggestive of COPD. Electronically Signed   By: Signa Kell M.D.   On: 05/26/2023 13:29   CT ABDOMEN PELVIS W CONTRAST  Result Date: 05/24/2023 CLINICAL DATA:  Abdominal pain, headache, weakness EXAM: CT ABDOMEN AND PELVIS WITH CONTRAST TECHNIQUE: Multidetector CT imaging of the abdomen and pelvis was performed using the standard protocol following bolus administration of intravenous contrast. RADIATION DOSE REDUCTION: This exam was performed according to the departmental dose-optimization program which includes automated exposure control, adjustment of the mA and/or kV according to patient size and/or use of iterative reconstruction technique. CONTRAST:  OMNIPAQUE IOHEXOL 300 MG/ML  SOLN COMPARISON:  03/26/2015 FINDINGS: Lower chest: Mild dependent atelectasis in the bilateral lower lobes. Hepatobiliary: Liver is within normal limits. Gallbladder is unremarkable. No intrahepatic or extrahepatic duct dilatation. Pancreas: Within normal limits. Spleen: Within normal limits. Adrenals/Urinary Tract: Adrenal glands are  within normal limits. Simple bilateral renal cysts, measuring up to 17 mm in the medial right upper kidney (series 7/image 19), benign (Bosniak I). No follow-up is recommended. Punctate nonobstructing left lower pole renal calculus (series 2/image 35). No hydronephrosis. Bladder is within normal limits. Stomach/Bowel: Stomach is within normal limits. No evidence of bowel obstruction. Appendix is not discretely visualized. No colonic wall thickening or inflammatory changes. Vascular/Lymphatic: No evidence of abdominal aortic aneurysm. Atherosclerotic calcifications of the abdominal aorta and branch vessels, although vessels remain patent. No suspicious abdominopelvic lymphadenopathy. Reproductive: Prostate is unremarkable. Other: No abdominopelvic ascites. Musculoskeletal: Mild degenerative changes at L4-5. IMPRESSION: No CT findings to account for the patient's abdominal pain. Punctate nonobstructing left lower pole renal calculus. No hydronephrosis. Electronically Signed   By: Charline Bills M.D.   On: 05/24/2023 21:25   CT ORBITS W WO CONTRAST  Result Date: 05/06/2023 CLINICAL DATA:  Initial evaluation for trauma, infection. EXAM: CT HEAD AND ORBITS WITHOUT AND WITH CONTRAST TECHNIQUE: Contiguous axial images were obtained from the base of the skull through the vertex with and without contrast. Multidetector CT imaging of the orbits was performed using the standard protocol with and without intravenous contrast. RADIATION DOSE REDUCTION: This exam was performed according to the departmental dose-optimization program which includes automated exposure control, adjustment of the mA and/or kV according to patient size and/or use of iterative reconstruction technique. CONTRAST:  75mL OMNIPAQUE IOHEXOL 350 MG/ML SOLN COMPARISON:  Prior study from  03/26/2015. FINDINGS: CT HEAD FINDINGS Brain: Cerebral volume within normal limits. No acute intracranial hemorrhage. No acute large vessel territory infarct. No mass  lesion, mass effect or midline shift. No hydrocephalus or extra-axial fluid collection. No abnormal enhancement. Vascular: No abnormal hyperdense vessel seen prior to contrast administration. Normal intravascular enhancement seen following contrast administration. Skull: Heterogeneous rim enhancing collection at the left frontal scalp measures 1.4 x 4.2 x 3.7 cm, indeterminate, but suspicious for possible infection with abscess. Underlying calvarium intact. No fracture. No visible evidence for intracranial spread of infection. Other: Mastoid air cells and middle ear cavities are clear. CT ORBITS FINDINGS Orbits: Globes are symmetric in size with normal appearance and morphology. Optic nerves symmetric and within normal limits. Extra-ocular muscles symmetric and normal. Intraconal and extraconal fat maintained. Lacrimal glands normal. No abnormal enhancement. No evidence for periorbital or intraorbital cellulitis. No collections. Visualized sinuses: Scattered mucoperiosteal thickening present about the ethmoidal air cells. Visualized paranasal sinuses are otherwise clear. Soft tissues: Unremarkable. IMPRESSION: 1. 1.4 x 4.2 x 3.7 cm rim enhancing collection at the left frontal scalp, indeterminate, but suspicious for possible infection with abscess. No visible evidence for intracranial spread of infection. 2. No other acute intracranial abnormality. No CT evidence for trauma. 3. Normal CT of the orbits. No evidence for periorbital or intraorbital cellulitis. Electronically Signed   By: Rise Mu M.D.   On: 05/06/2023 22:37   CT HEAD W & WO CONTRAST ( )  Result Date: 05/06/2023 CLINICAL DATA:  Initial evaluation for trauma, infection. EXAM: CT HEAD AND ORBITS WITHOUT AND WITH CONTRAST TECHNIQUE: Contiguous axial images were obtained from the base of the skull through the vertex with and without contrast. Multidetector CT imaging of the orbits was performed using the standard protocol with and without  intravenous contrast. RADIATION DOSE REDUCTION: This exam was performed according to the departmental dose-optimization program which includes automated exposure control, adjustment of the mA and/or kV according to patient size and/or use of iterative reconstruction technique. CONTRAST:  75mL OMNIPAQUE IOHEXOL 350 MG/ML SOLN COMPARISON:  Prior study from 03/26/2015. FINDINGS: CT HEAD FINDINGS Brain: Cerebral volume within normal limits. No acute intracranial hemorrhage. No acute large vessel territory infarct. No mass lesion, mass effect or midline shift. No hydrocephalus or extra-axial fluid collection. No abnormal enhancement. Vascular: No abnormal hyperdense vessel seen prior to contrast administration. Normal intravascular enhancement seen following contrast administration. Skull: Heterogeneous rim enhancing collection at the left frontal scalp measures 1.4 x 4.2 x 3.7 cm, indeterminate, but suspicious for possible infection with abscess. Underlying calvarium intact. No fracture. No visible evidence for intracranial spread of infection. Other: Mastoid air cells and middle ear cavities are clear. CT ORBITS FINDINGS Orbits: Globes are symmetric in size with normal appearance and morphology. Optic nerves symmetric and within normal limits. Extra-ocular muscles symmetric and normal. Intraconal and extraconal fat maintained. Lacrimal glands normal. No abnormal enhancement. No evidence for periorbital or intraorbital cellulitis. No collections. Visualized sinuses: Scattered mucoperiosteal thickening present about the ethmoidal air cells. Visualized paranasal sinuses are otherwise clear. Soft tissues: Unremarkable. IMPRESSION: 1. 1.4 x 4.2 x 3.7 cm rim enhancing collection at the left frontal scalp, indeterminate, but suspicious for possible infection with abscess. No visible evidence for intracranial spread of infection. 2. No other acute intracranial abnormality. No CT evidence for trauma. 3. Normal CT of the orbits.  No evidence for periorbital or intraorbital cellulitis. Electronically Signed   By: Rise Mu M.D.   On: 05/06/2023 22:37    Microbiology: Results for  orders placed or performed during the hospital encounter of 05/06/23  Aerobic Culture w Gram Stain (superficial specimen)     Status: None   Collection Time: 05/07/23  1:25 AM   Specimen: Wound  Result Value Ref Range Status   Specimen Description WOUND  Final   Special Requests LEFT FOREHEAD SCALP  Final   Gram Stain   Final    MODERATE WBC PRESENT, PREDOMINANTLY PMN MODERATE GRAM POSITIVE COCCI Performed at [facility], [address] [place], [place] [address]    Culture ABUNDANT STAPHYLOCOCCUS AUREUS  Final   Report Status 05/09/2023 FINAL  Final   Organism ID, Bacteria STAPHYLOCOCCUS AUREUS  Final      Susceptibility   Staphylococcus aureus - MIC*    CIPROFLOXACIN >=8 RESISTANT Resistant     ERYTHROMYCIN <=0.25 SENSITIVE Sensitive     GENTAMICIN <=0.5 SENSITIVE Sensitive     OXACILLIN 0.5 SENSITIVE Sensitive     TETRACYCLINE <=1 SENSITIVE Sensitive     VANCOMYCIN <=0.5 SENSITIVE Sensitive     TRIMETH/SULFA <=10 SENSITIVE Sensitive     CLINDAMYCIN <=0.25 SENSITIVE Sensitive     RIFAMPIN <=0.5 SENSITIVE Sensitive     Inducible Clindamycin NEGATIVE Sensitive     LINEZOLID 2 SENSITIVE Sensitive     * ABUNDANT STAPHYLOCOCCUS AUREUS    Labs: CBC: Recent Labs  Lab 05/24/23 1816 05/25/23 0310 05/26/23 0427 05/27/23 0420 05/28/23 0446 05/29/23 0409  WBC 9.8 8.0 9.4 13.9* 15.8* 14.7*  NEUTROABS 5.9  --   --   --   --   --   HGB 12.6* 12.1* 11.7* 12.2* 12.0* 12.5*  HCT 34.4* 33.6* 32.4* 35.5* 35.6* 36.2*  MCV 83.7 85.1 85.0 87.7 88.3 88.5  PLT 218 227 295 347 435* 532*   Basic Metabolic Panel: Recent Labs  Lab 05/25/23 0310 05/25/23 0944 05/25/23 1608 05/26/23 0427 05/27/23 0420 05/28/23 0446 05/29/23 0409  NA 124*   < > 124* 125* 130* 131* 131*  K 3.3*  --   --  3.3* 4.2 4.2 4.5  CL 90*  --    --  93* 97* 97* 96*  CO2 24  --   --  26 27 29 30   GLUCOSE 87  --   --  101* 105* 105* 101*  BUN 14  --   --  9 10 11 12   CREATININE 0.67  --   --  0.60* 0.69 0.55* 0.60*  CALCIUM 7.7*  --   --  7.6* 8.0* 8.2* 8.4*  MG 2.0  --   --  2.0 2.2 2.2 2.2   < > = values in this interval not displayed.   Liver Function Tests: Recent Labs  Lab 05/25/23 0310 05/26/23 0427 05/27/23 0420 05/28/23 0446 05/29/23 0409  AST 47* 49* 117* 122* 114*  ALT 61* 52* 83* 106* 112*  ALKPHOS 75 62 65 67 68  BILITOT 0.8 0.4 0.2* 0.3 0.6  PROT 5.5* 5.2* 5.6* 5.7* 6.0*  ALBUMIN 2.4* 2.3* 2.5* 2.6* 2.7*   CBG: No results for input(s): "GLUCAP" in the last 168 hours.  Discharge time spent: greater than 30 minutes.  This record has been created using Conservation officer, historic buildings. Errors have been sought and corrected,but may not always be located. Such creation errors do not reflect on the standard of care.   Signed: Arnetha Courser, MD [facility] 05/29/2023

## 2023-11-17 ENCOUNTER — Other Ambulatory Visit (HOSPITAL_COMMUNITY): Payer: Self-pay
